# Patient Record
Sex: Male | Born: 1975 | Hispanic: Yes | Marital: Married
Health system: Northeastern US, Academic
[De-identification: ages and names within clinical notes are randomized; demographics above are authoritative.]

---

## 2018-09-06 ENCOUNTER — Emergency Department (HOSPITAL_COMMUNITY): Payer: Self-pay

## 2018-09-06 ENCOUNTER — Encounter (HOSPITAL_COMMUNITY): Payer: Self-pay | Admitting: *Deleted

## 2018-09-06 ENCOUNTER — Inpatient Hospital Stay (HOSPITAL_COMMUNITY): Payer: Self-pay

## 2018-09-06 ENCOUNTER — Inpatient Hospital Stay (HOSPITAL_COMMUNITY)
Admission: EM | Admit: 2018-09-06 | Discharge: 2018-09-09 | DRG: 200 | Disposition: A | Discharge status: Home / Self Care | Payer: Self-pay | Attending: General Surgery | Admitting: General Surgery

## 2018-09-06 ENCOUNTER — Other Ambulatory Visit: Payer: Self-pay

## 2018-09-06 DIAGNOSIS — Y990 Civilian activity done for income or pay: Secondary | ICD-10-CM

## 2018-09-06 DIAGNOSIS — S27321A Contusion of lung, unilateral, initial encounter: Secondary | ICD-10-CM | POA: Diagnosis present

## 2018-09-06 DIAGNOSIS — S2242XA Multiple fractures of ribs, left side, initial encounter for closed fracture: Secondary | ICD-10-CM | POA: Diagnosis present

## 2018-09-06 DIAGNOSIS — W19XXXA Unspecified fall, initial encounter: Secondary | ICD-10-CM

## 2018-09-06 DIAGNOSIS — M542 Cervicalgia: Secondary | ICD-10-CM | POA: Diagnosis present

## 2018-09-06 DIAGNOSIS — Y93H3 Activity, building and construction: Secondary | ICD-10-CM

## 2018-09-06 DIAGNOSIS — W132XXA Fall from, out of or through roof, initial encounter: Secondary | ICD-10-CM | POA: Diagnosis present

## 2018-09-06 DIAGNOSIS — J939 Pneumothorax, unspecified: Secondary | ICD-10-CM

## 2018-09-06 DIAGNOSIS — S300XXA Contusion of lower back and pelvis, initial encounter: Secondary | ICD-10-CM | POA: Diagnosis present

## 2018-09-06 DIAGNOSIS — S270XXA Traumatic pneumothorax, initial encounter: Principal | ICD-10-CM | POA: Diagnosis present

## 2018-09-06 DIAGNOSIS — Z4682 Encounter for fitting and adjustment of non-vascular catheter: Secondary | ICD-10-CM

## 2018-09-06 DIAGNOSIS — M25522 Pain in left elbow: Secondary | ICD-10-CM | POA: Diagnosis present

## 2018-09-06 DIAGNOSIS — Z880 Allergy status to penicillin: Secondary | ICD-10-CM

## 2018-09-06 DIAGNOSIS — Y92008 Other place in unspecified non-institutional (private) residence as the place of occurrence of the external cause: Secondary | ICD-10-CM

## 2018-09-06 LAB — CBC
HCT: 48.9 % (ref 39.0–52.0)
HEMATOCRIT: 47.4 % (ref 39.0–52.0)
HEMOGLOBIN: 15.4 g/dL (ref 13.0–17.0)
Hemoglobin: 15.7 g/dL (ref 13.0–17.0)
MCH: 30.1 pg (ref 26.0–34.0)
MCH: 29.5 pg (ref 26.0–34.0)
MCHC: 33.1 g/dL (ref 30.0–36.0)
MCHC: 31.5 g/dL (ref 30.0–36.0)
MCV: 91 fL (ref 78.0–100.0)
MCV: 93.7 fL (ref 78.0–100.0)
Platelets: 227 10*3/uL (ref 150–400)
Platelets: 225 10*3/uL (ref 150–400)
RBC: 5.21 MIL/uL (ref 4.22–5.81)
RBC: 5.22 MIL/uL (ref 4.22–5.81)
RDW: 13.1 % (ref 11.5–15.5)
RDW: 13.2 % (ref 11.5–15.5)
WBC: 9.1 10*3/uL (ref 4.0–10.5)
WBC: 14.9 10*3/uL — ABNORMAL HIGH (ref 4.0–10.5)

## 2018-09-06 LAB — I-STAT CHEM 8, ED
BUN: 17 mg/dL (ref 6–20)
CALCIUM ION: 1.1 mmol/L — AB (ref 1.15–1.40)
Chloride: 106 mmol/L (ref 98–111)
Creatinine, Ser: 1.1 mg/dL (ref 0.61–1.24)
Glucose, Bld: 117 mg/dL — ABNORMAL HIGH (ref 70–99)
HEMATOCRIT: 46 % (ref 39.0–52.0)
Hemoglobin: 15.6 g/dL (ref 13.0–17.0)
Potassium: 3.5 mmol/L (ref 3.5–5.1)
SODIUM: 141 mmol/L (ref 135–145)
TCO2: 23 mmol/L (ref 22–32)

## 2018-09-06 LAB — I-STAT CG4 LACTIC ACID, ED: LACTIC ACID, VENOUS: 2.16 mmol/L — AB (ref 0.5–1.9)

## 2018-09-06 LAB — COMPREHENSIVE METABOLIC PANEL
ALBUMIN: 4.2 g/dL (ref 3.5–5.0)
ALT: 37 U/L (ref 0–44)
AST: 39 U/L (ref 15–41)
Alkaline Phosphatase: 57 U/L (ref 38–126)
Anion gap: 10 (ref 5–15)
BUN: 15 mg/dL (ref 6–20)
CHLORIDE: 108 mmol/L (ref 98–111)
CO2: 23 mmol/L (ref 22–32)
Calcium: 9.1 mg/dL (ref 8.9–10.3)
Creatinine, Ser: 1.13 mg/dL (ref 0.61–1.24)
GFR calc Af Amer: >60 mL/min (ref >60)
GFR calc non Af Amer: >60 mL/min (ref >60)
GLUCOSE: 121 mg/dL — AB (ref 70–99)
POTASSIUM: 3.6 mmol/L (ref 3.5–5.1)
SODIUM: 141 mmol/L (ref 135–145)
Total Bilirubin: 0.5 mg/dL (ref 0.3–1.2)
Total Protein: 7.1 g/dL (ref 6.5–8.1)

## 2018-09-06 LAB — SAMPLE TO BLOOD BANK

## 2018-09-06 LAB — CREATININE, SERUM
Creatinine, Ser: 1.02 mg/dL (ref 0.61–1.24)
GFR calc Af Amer: >60 mL/min (ref >60)

## 2018-09-06 LAB — PROTIME-INR
INR: 1.06
Prothrombin Time: 13.7 seconds (ref 11.4–15.2)

## 2018-09-06 LAB — ETHANOL: Alcohol, Ethyl (B): <10 mg/dL (ref <10)

## 2018-09-06 MED ORDER — FENTANYL CITRATE (PF) 100 MCG/2ML IJ SOLN: 50.0000 ug | Freq: Once | INTRAMUSCULAR | Status: DC

## 2018-09-06 MED ORDER — LIDOCAINE-EPINEPHRINE (PF) 2 %-1:200000 IJ SOLN
20.0000 mL | Freq: Once | INTRAMUSCULAR | Status: DC
  Filled 2018-09-06: qty 20

## 2018-09-06 MED ORDER — METHOCARBAMOL 1000 MG/10ML IJ SOLN
500.0000 mg | Freq: Three times a day (TID) | INTRAVENOUS | Status: DC
  Administered 2018-09-06 – 2018-09-07 (×2): 500 mg via INTRAVENOUS
  Filled 2018-09-06 (×2): qty 5
  Filled 2018-09-06: qty 500
  Filled 2018-09-06 (×2): qty 5

## 2018-09-06 MED ORDER — METOPROLOL TARTRATE 5 MG/5ML IV SOLN: 5.0000 mg | Freq: Four times a day (QID) | INTRAVENOUS | Status: DC | PRN

## 2018-09-06 MED ORDER — FENTANYL CITRATE (PF) 100 MCG/2ML IJ SOLN
100.0000 ug | INTRAMUSCULAR | Status: DC | PRN
  Administered 2018-09-06: 100 ug via INTRAVENOUS
  Filled 2018-09-06 (×2): qty 2

## 2018-09-06 MED ORDER — ACETAMINOPHEN 500 MG PO TABS
1000.0000 mg | ORAL_TABLET | Freq: Four times a day (QID) | ORAL | Status: DC
  Administered 2018-09-06 – 2018-09-09 (×10): 1000 mg via ORAL
  Filled 2018-09-06 (×11): qty 2

## 2018-09-06 MED ORDER — FENTANYL CITRATE (PF) 100 MCG/2ML IJ SOLN
50.0000 ug | Freq: Once | INTRAMUSCULAR | Status: AC
  Administered 2018-09-06: 50 ug via INTRAVENOUS

## 2018-09-06 MED ORDER — KCL IN DEXTROSE-NACL 20-5-0.45 MEQ/L-%-% IV SOLN
INTRAVENOUS | Status: DC
  Administered 2018-09-06 – 2018-09-08 (×4): via INTRAVENOUS
  Filled 2018-09-06 (×4): qty 1000

## 2018-09-06 MED ORDER — KETAMINE HCL 50 MG/5ML IJ SOSY: 2.0000 mg/kg | PREFILLED_SYRINGE | Freq: Once | INTRAMUSCULAR | Status: DC

## 2018-09-06 MED ORDER — ONDANSETRON HCL 4 MG/2ML IJ SOLN: 4.0000 mg | Freq: Four times a day (QID) | INTRAMUSCULAR | Status: DC | PRN

## 2018-09-06 MED ORDER — HYDROMORPHONE HCL 1 MG/ML IJ SOLN
1.0000 mg | INTRAMUSCULAR | Status: DC | PRN
  Administered 2018-09-06: 1 mg via INTRAVENOUS
  Filled 2018-09-06: qty 1

## 2018-09-06 MED ORDER — IOPAMIDOL (ISOVUE-300) INJECTION 61%
INTRAVENOUS | Status: AC
  Filled 2018-09-06: qty 100

## 2018-09-06 MED ORDER — OXYCODONE HCL 5 MG PO TABS
5.0000 mg | ORAL_TABLET | ORAL | Status: DC | PRN
  Administered 2018-09-06 – 2018-09-07 (×3): 10 mg via ORAL
  Administered 2018-09-07 – 2018-09-09 (×3): 5 mg via ORAL
  Filled 2018-09-06: qty 1
  Filled 2018-09-06 (×2): qty 2
  Filled 2018-09-06 (×2): qty 1
  Filled 2018-09-06: qty 2

## 2018-09-06 MED ORDER — ONDANSETRON 4 MG PO TBDP: 4.0000 mg | ORAL_TABLET | Freq: Four times a day (QID) | ORAL | Status: DC | PRN

## 2018-09-06 MED ORDER — DOCUSATE SODIUM 100 MG PO CAPS
100.0000 mg | ORAL_CAPSULE | Freq: Two times a day (BID) | ORAL | Status: DC
  Administered 2018-09-06 – 2018-09-09 (×7): 100 mg via ORAL
  Filled 2018-09-06 (×7): qty 1

## 2018-09-06 MED ORDER — GABAPENTIN 300 MG PO CAPS
300.0000 mg | ORAL_CAPSULE | Freq: Three times a day (TID) | ORAL | Status: DC
  Administered 2018-09-06 – 2018-09-09 (×9): 300 mg via ORAL
  Filled 2018-09-06 (×9): qty 1

## 2018-09-06 MED ORDER — FAMOTIDINE IN NACL 20-0.9 MG/50ML-% IV SOLN
20.0000 mg | INTRAVENOUS | Status: DC
  Filled 2018-09-06: qty 50

## 2018-09-06 MED ORDER — MIDAZOLAM HCL 2 MG/2ML IJ SOLN
INTRAMUSCULAR | Status: AC
  Filled 2018-09-06: qty 2

## 2018-09-06 MED ORDER — MIDAZOLAM HCL 2 MG/2ML IJ SOLN
2.0000 mg | Freq: Once | INTRAMUSCULAR | Status: AC
  Administered 2018-09-06: 2 mg via INTRAVENOUS

## 2018-09-06 MED ORDER — IOPAMIDOL (ISOVUE-300) INJECTION 61%
100.0000 mL | Freq: Once | INTRAVENOUS | Status: AC | PRN
  Administered 2018-09-06: 100 mL via INTRAVENOUS

## 2018-09-06 MED ORDER — SODIUM CHLORIDE 0.9 % IV BOLUS
1000.0000 mL | Freq: Once | INTRAVENOUS | Status: AC
  Administered 2018-09-06: 1000 mL via INTRAVENOUS

## 2018-09-06 MED ORDER — ENOXAPARIN SODIUM 40 MG/0.4ML ~~LOC~~ SOLN
40.0000 mg | SUBCUTANEOUS | Status: DC
  Administered 2018-09-07 – 2018-09-09 (×3): 40 mg via SUBCUTANEOUS
  Filled 2018-09-06 (×3): qty 0.4

## 2018-09-06 MED ORDER — PANTOPRAZOLE SODIUM 40 MG PO TBEC
40.0000 mg | DELAYED_RELEASE_TABLET | ORAL | Status: DC
  Administered 2018-09-06 – 2018-09-08 (×3): 40 mg via ORAL
  Filled 2018-09-06 (×3): qty 1

## 2018-09-06 NOTE — ED Triage Notes (Signed)
Tc to x-ray for portable post chest tube placement

## 2018-09-06 NOTE — Procedures (Signed)
Chest Tube Insertion Procedure Note  Indications:  Clinically significant Pneumothorax  Pre-operative Diagnosis: Pneumothorax  Post-operative Diagnosis: Pneumothorax  Procedure Details  Informed consent was obtained for the procedure, including sedation.  Risks of lung perforation, hemorrhage, arrhythmia, and adverse drug reaction were discussed.   After sterile skin prep, using standard technique, a 14 French tube was placed in the left anterior 5th rib space.  Findings: None  Estimated Blood Loss:  Minimal         Specimens:  None              Complications:  None; patient tolerated the procedure well.         Disposition: admission to trauma service, stable condition          Condition: stable   Wells Guiles , [facility] Surgery 09/06/2018, 4:16 PM Pager: [phone] Mon-Fri 7:00 am-4:30 pm Sat-Sun 7:00 am-11:30 am

## 2018-09-06 NOTE — ED Notes (Signed)
MD notified of elevated lactic acid result

## 2018-09-06 NOTE — ED Provider Notes (Signed)
[facility] EMERGENCY DEPARTMENT Provider Note   CSN: [id number] Arrival date & time: 09/06/18  1159     History   Chief Complaint Chief Complaint  Patient presents with  . Fall    HPI Douglas Ayers is a 42 y.o. male who presents with a fall. No significant PMH per patient. He states that he was on a 1 story house and he slipped and fell off. He denies LOC or seizure. He was found by his coworkers. EMS responded and noted that the patient complained of left chest and abdominal pain and as well as left arm and leg pain. History is limited by language barrier.   LEVEL 5 caveat due to acuity and language barrier  HPI  History reviewed. No pertinent past medical history.  There are no active problems to display for this patient.   History reviewed. No pertinent surgical history.      Home Medications    Prior to Admission medications   Not on File    Family History History reviewed. No pertinent family history.  Social History Social History   Tobacco Use  . Smoking status: Never Smoker  . Smokeless tobacco: Never Used  Substance Use Topics  . Alcohol use: Not Currently  . Drug use: Never     Allergies   Patient has no known allergies.   Review of Systems Review of Systems  Respiratory: Positive for shortness of breath.   Cardiovascular: Positive for chest pain.  Gastrointestinal: Positive for abdominal pain. Negative for nausea and vomiting.  Musculoskeletal: Positive for arthralgias, back pain, joint swelling, myalgias and neck pain.  Skin: Positive for wound.  Neurological: Positive for headaches. Negative for syncope.  All other systems reviewed and are negative.    Physical Exam Updated Vital Signs BP (!) 138/95 (BP Location: Right Arm)   Pulse 87   Temp 98.9 F (37.2 C) (Oral)   Resp 20   Ht 5\' 5"  (1.651 m)   Wt 68 kg   SpO2 93%   BMI 24.96 kg/m   Physical Exam  Constitutional: He is oriented to person, place,  and time. He appears well-developed and well-nourished. No distress.  Calm, cooperative. Spanish speaking GCS 15. In C-collar  HENT:  Head: Normocephalic and atraumatic.  Eyes: Pupils are equal, round, and reactive to light. Conjunctivae are normal. Right eye exhibits no discharge. Left eye exhibits no discharge. No scleral icterus.  Neck: Normal range of motion.  Cardiovascular: Normal rate and regular rhythm.  Intact distal pulses  Pulmonary/Chest: Effort normal. No respiratory distress. He has decreased breath sounds. He exhibits tenderness.  Abdominal: Soft. Bowel sounds are normal. He exhibits no distension. There is tenderness (L sided abdomen and flank tenderness).  Musculoskeletal:  L upper extremity: Shoudler tenderness. No obvious deformity. Elbow swelling over olecranon with abrasions. 2+ radial pulse  Neurological: He is alert and oriented to person, place, and time.  Skin: Skin is warm and dry.  Psychiatric: He has a normal mood and affect. His behavior is normal.  Nursing note and vitals reviewed.    ED Treatments / Results  Labs (all labs ordered are listed, but only abnormal results are displayed) Labs Reviewed  COMPREHENSIVE METABOLIC PANEL - Abnormal; Notable for the following components:      Result Value   Glucose, Bld 121 (*)    All other components within normal limits  I-STAT CHEM 8, ED - Abnormal; Notable for the following components:   Glucose, Bld 117 (*)  Calcium, Ion 1.10 (*)    All other components within normal limits  I-STAT CG4 LACTIC ACID, ED - Abnormal; Notable for the following components:   Lactic Acid, Venous 2.16 (*)    All other components within normal limits  CBC  ETHANOL  PROTIME-INR  CDS SEROLOGY  URINALYSIS, ROUTINE W REFLEX MICROSCOPIC  SAMPLE TO BLOOD BANK    EKG None  Radiology Ct Head Wo Contrast  Result Date: 09/06/2018 CLINICAL DATA:  Pain following fall EXAM: CT HEAD WITHOUT CONTRAST CT CERVICAL SPINE WITHOUT CONTRAST  TECHNIQUE: Multidetector CT imaging of the head and cervical spine was performed following the standard protocol without intravenous contrast. Multiplanar CT image reconstructions of the cervical spine were also generated. COMPARISON:  None. FINDINGS: CT HEAD FINDINGS Brain: The ventricles are normal in size and configuration. There is no intracranial mass, hemorrhage, extra-axial fluid collection, or midline shift. The gray-white compartments appear normal. No evident acute infarct. Vascular: No hyperdense vessel. There is no appreciable vascular calcification. Skull: The bony calvarium appears intact. Sinuses/Orbits: There are retention cysts in the maxillary antra bilaterally, larger on the right than on the left. There is opacification and mucosal thickening in several ethmoid air cells bilaterally. Other visualized paranasal sinuses are clear. Orbits appear symmetric bilaterally. Other: Mastoid air cells are clear. CT CERVICAL SPINE FINDINGS Alignment: There is no evident spondylolisthesis. Skull base and vertebrae: Skull base and craniocervical junction regions appear normal. No evident fracture. No blastic or lytic bone lesions. Soft tissues and spinal canal: Prevertebral soft tissues and predental space regions are normal. No paraspinous lesion. No evident cord or canal hematoma. Disc levels: There is no appreciable disc space narrowing. There is calcification in the anterior ligament at C4-5. There is no appreciable nerve root edema or effacement. No disc extrusion or stenosis. Upper chest: There is a sizable pneumothorax on the left. Visualized right lung is clear. Other: None IMPRESSION: CT head: No intracranial mass or hemorrhage. Gray-white compartments appear normal. There is multifocal paranasal sinus disease. CT cervical spine: No fracture or spondylolisthesis. No appreciable nerve root edema or effacement. Sizable pneumothorax on the left. Critical Value/emergent results were called by telephone at  the time of interpretation on 09/06/2018 at 2:00 pm to [facility], who verbally acknowledged these results. Electronically Signed   By: Bretta Bang III M.D.   On: 09/06/2018 14:01   Ct Chest W Contrast  Result Date: 09/06/2018 CLINICAL DATA:  Left-sided abdominal pain and left flank bruising secondary to a fall from a roof today. EXAM: CT CHEST, ABDOMEN, AND PELVIS WITH CONTRAST TECHNIQUE: Multidetector CT imaging of the chest, abdomen and pelvis was performed following the standard protocol during bolus administration of intravenous contrast. CONTRAST:  ISOVUE-300 IOPAMIDOL (ISOVUE-300) INJECTION 61% COMPARISON:  None. FINDINGS: CT CHEST FINDINGS Cardiovascular: No significant vascular findings. Normal heart size. No pericardial effusion. Mediastinum/Nodes: No enlarged mediastinal, hilar, or axillary lymph nodes. Thyroid gland, trachea, and esophagus demonstrate no significant findings. Lungs/Pleura: There is a 40% left pneumothorax. There is a focal contusion of the left lower lobe adjacent to a fracture of the lateral aspect of the left seventh rib. There is a small left pleural effusion. Right lung is clear. Musculoskeletal: There are slightly displaced fractures of the lateral aspects of the left third, 6, seventh, and eighth ribs. There are also nondisplaced fractures of the posterior aspects of the left sixth, seventh and eighth ribs. CT ABDOMEN PELVIS FINDINGS Hepatobiliary: Diffuse slight hepatic steatosis. Liver parenchyma is otherwise normal. Biliary tree  is normal. Pancreas: Unremarkable. No pancreatic ductal dilatation or surrounding inflammatory changes. Spleen: No splenic injury or perisplenic hematoma. Adrenals/Urinary Tract: Adrenal glands are unremarkable. Kidneys are normal except for a 3.9 cm simple appearing cyst on the lower pole of the left kidney. Bladder is unremarkable. Stomach/Bowel: Stomach is within normal limits. Appendix appears normal. No evidence of bowel wall  thickening, distention, or inflammatory changes. Vascular/Lymphatic: No significant vascular findings are present. No enlarged abdominal or pelvic lymph nodes. Reproductive: Prostate is unremarkable. Other: Soft tissue contusion of the subcutaneous fat in the superior aspect of the left buttock just above the left posterosuperior iliac crest. Musculoskeletal: Negative. IMPRESSION: 1. 40% left pneumothorax. 2. Multiple left rib fractures as described. 3. Small focal pulmonary contusion in the left lower lobe. 4. Small left pleural effusion or hemothorax. 5. No acute intra-abdominal or intrapelvic abnormality. 6. Contusion of the subcutaneous fat of the superior aspect of the left buttock. Critical Value/emergent results were called by telephone at the time of interpretation on 09/06/2018 at 2:08 pm to Dr. Terance Hart , who verbally acknowledged these results. Electronically Signed   By: Francene Boyers M.D.   On: 09/06/2018 14:16   Ct Cervical Spine Wo Contrast  Result Date: 09/06/2018 CLINICAL DATA:  Pain following fall EXAM: CT HEAD WITHOUT CONTRAST CT CERVICAL SPINE WITHOUT CONTRAST TECHNIQUE: Multidetector CT imaging of the head and cervical spine was performed following the standard protocol without intravenous contrast. Multiplanar CT image reconstructions of the cervical spine were also generated. COMPARISON:  None. FINDINGS: CT HEAD FINDINGS Brain: The ventricles are normal in size and configuration. There is no intracranial mass, hemorrhage, extra-axial fluid collection, or midline shift. The gray-white compartments appear normal. No evident acute infarct. Vascular: No hyperdense vessel. There is no appreciable vascular calcification. Skull: The bony calvarium appears intact. Sinuses/Orbits: There are retention cysts in the maxillary antra bilaterally, larger on the right than on the left. There is opacification and mucosal thickening in several ethmoid air cells bilaterally. Other visualized paranasal  sinuses are clear. Orbits appear symmetric bilaterally. Other: Mastoid air cells are clear. CT CERVICAL SPINE FINDINGS Alignment: There is no evident spondylolisthesis. Skull base and vertebrae: Skull base and craniocervical junction regions appear normal. No evident fracture. No blastic or lytic bone lesions. Soft tissues and spinal canal: Prevertebral soft tissues and predental space regions are normal. No paraspinous lesion. No evident cord or canal hematoma. Disc levels: There is no appreciable disc space narrowing. There is calcification in the anterior ligament at C4-5. There is no appreciable nerve root edema or effacement. No disc extrusion or stenosis. Upper chest: There is a sizable pneumothorax on the left. Visualized right lung is clear. Other: None IMPRESSION: CT head: No intracranial mass or hemorrhage. Gray-white compartments appear normal. There is multifocal paranasal sinus disease. CT cervical spine: No fracture or spondylolisthesis. No appreciable nerve root edema or effacement. Sizable pneumothorax on the left. Critical Value/emergent results were called by telephone at the time of interpretation on 09/06/2018 at 2:00 pm to [facility] [place], who verbally acknowledged these results. Electronically Signed   By: Bretta Bang III M.D.   On: 09/06/2018 14:01   Ct Abdomen Pelvis W Contrast  Result Date: 09/06/2018 CLINICAL DATA:  Left-sided abdominal pain and left flank bruising secondary to a fall from a roof today. EXAM: CT CHEST, ABDOMEN, AND PELVIS WITH CONTRAST TECHNIQUE: Multidetector CT imaging of the chest, abdomen and pelvis was performed following the standard protocol during bolus administration of intravenous contrast. CONTRAST:  ISOVUE-300 IOPAMIDOL (ISOVUE-300) INJECTION 61% COMPARISON:  None. FINDINGS: CT CHEST FINDINGS Cardiovascular: No significant vascular findings. Normal heart size. No pericardial effusion. Mediastinum/Nodes: No enlarged mediastinal, hilar, or axillary  lymph nodes. Thyroid gland, trachea, and esophagus demonstrate no significant findings. Lungs/Pleura: There is a 40% left pneumothorax. There is a focal contusion of the left lower lobe adjacent to a fracture of the lateral aspect of the left seventh rib. There is a small left pleural effusion. Right lung is clear. Musculoskeletal: There are slightly displaced fractures of the lateral aspects of the left third, 6, seventh, and eighth ribs. There are also nondisplaced fractures of the posterior aspects of the left sixth, seventh and eighth ribs. CT ABDOMEN PELVIS FINDINGS Hepatobiliary: Diffuse slight hepatic steatosis. Liver parenchyma is otherwise normal. Biliary tree is normal. Pancreas: Unremarkable. No pancreatic ductal dilatation or surrounding inflammatory changes. Spleen: No splenic injury or perisplenic hematoma. Adrenals/Urinary Tract: Adrenal glands are unremarkable. Kidneys are normal except for a 3.9 cm simple appearing cyst on the lower pole of the left kidney. Bladder is unremarkable. Stomach/Bowel: Stomach is within normal limits. Appendix appears normal. No evidence of bowel wall thickening, distention, or inflammatory changes. Vascular/Lymphatic: No significant vascular findings are present. No enlarged abdominal or pelvic lymph nodes. Reproductive: Prostate is unremarkable. Other: Soft tissue contusion of the subcutaneous fat in the superior aspect of the left buttock just above the left posterosuperior iliac crest. Musculoskeletal: Negative. IMPRESSION: 1. 40% left pneumothorax. 2. Multiple left rib fractures as described. 3. Small focal pulmonary contusion in the left lower lobe. 4. Small left pleural effusion or hemothorax. 5. No acute intra-abdominal or intrapelvic abnormality. 6. Contusion of the subcutaneous fat of the superior aspect of the left buttock. Critical Value/emergent results were called by telephone at the time of interpretation on 09/06/2018 at 2:08 pm to Dr. Terance Hart , who  verbally acknowledged these results. Electronically Signed   By: Francene Boyers M.D.   On: 09/06/2018 14:16   Dg Pelvis Portable  Result Date: 09/06/2018 CLINICAL DATA:  Shortness of breath EXAM: PORTABLE PELVIS 1-2 VIEWS COMPARISON:  None. FINDINGS: The bony pelvis is subjectively adequately mineralized. There is no acute or healing fracture. The visualized portions of the hips exhibit no acute abnormalities. IMPRESSION: There is no acute bony abnormality of the pelvis. Electronically Signed   By: David  [place] M.D.   On: 09/06/2018 12:56   Dg Chest Port 1 View  Result Date: 09/06/2018 CLINICAL DATA:  Shortness of breath EXAM: PORTABLE CHEST 1 VIEW COMPARISON:  None in PACs FINDINGS: The lungs are mildly hypoinflated. There is hazy increased density laterally in the left mid and lower hemithorax. There are mildly displaced fractures of the lateral aspects of the left sixth, seventh, and eighth ribs. There is deformity of the lateral aspect of the left third rib which is of uncertain age. There is no pneumothorax or pleural effusion. The heart is top-normal in size. The pulmonary vascularity is normal. The bony thorax exhibits no acute abnormality. IMPRESSION: Abnormal appearance of the sixth through eighth ribs on the left with a small amount of increased parenchymal density in the left lung. This may reflect presence of acute fractures with pulmonary contusion. There is no pneumothorax or pleural effusion. There is deformity of the lateral aspect of the left third rib which is of uncertain age. Top-normal cardiac size without pulmonary vascular congestion or pulmonary edema. Electronically Signed   By: David  [place] M.D.   On: 09/06/2018 12:56    Procedures  Procedures (including critical care time)  Medications Ordered in ED Medications  sodium chloride 0.9 % bolus 1,000 mL (has no administration in time range)  fentaNYL (SUBLIMAZE) injection 100 mcg (100 mcg Intravenous Given 09/06/18 1257)    iopamidol (ISOVUE-300) 61 % injection (has no administration in time range)  lidocaine-EPINEPHrine (XYLOCAINE W/EPI) 2 %-1:200000 (PF) injection 20 mL (has no administration in time range)  ketamine 50 mg in normal saline 5 mL (10 mg/mL) syringe (has no administration in time range)  midazolam (VERSED) 2 MG/2ML injection (has no administration in time range)  iopamidol (ISOVUE-300) 61 % injection 100 mL (100 mLs Intravenous Contrast Given 09/06/18 1327)  fentaNYL (SUBLIMAZE) injection 50 mcg (50 mcg Intravenous Given 09/06/18 1459)  midazolam (VERSED) injection 2 mg (2 mg Intravenous Given 09/06/18 1457)     Initial Impression / Assessment and Plan / ED Course  I have reviewed the triage vital signs and the nursing notes.  Pertinent labs & imaging results that were available during my care of the patient were reviewed by me and considered in my medical decision making (see chart for details).  42PM 42 year old male presents with a fall from 1 story building. His vitals are normal. On exam he reports diffuse pain but primarily on the L side. Breath sounds are decreased bilaterally because of pain. Initial CXR and pelvis xray were negative. Labs are normal other than lactic acid which was slightly elevated. Bedside FAST exam was performed which was negative. Will obtain CT head, C-spine, chest, abdomen/pelvis.   2PM Received call from radiology that the patient has large 40% pneumothorax on the L side with multiple rib fractures and pulmonary contusion. Shared visit with Dr. Jacqulyn Bath. Will consult trauma  2:15PM Received call from Dr. Janee Morn with trauma - he will come to see pt to insert chest tube.   Final Clinical Impressions(s) / ED Diagnoses   Final diagnoses:  Fall, initial encounter  Pneumothorax, unspecified type  Closed fracture of multiple ribs of left side, initial encounter  Contusion of left lung, initial encounter    ED Discharge Orders    None       Bethel Born,  PA-C 09/06/18 1535    Long, Arlyss Repress, MD 09/06/18 [phone]

## 2018-09-06 NOTE — H&P (Signed)
[facility] Admission Note  Douglas Ayers 06-29-1976  [id number].     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 He is Spanish speaking. Does not remember falling, thinks he may have +LOC. Complains of left sided chest and abdominal pain. Denies medications or other medical problems. Allergic to PCNs. Denies alcohol or illicit drug use. He was brought in via EMS. Patient's co-workers were at the bedside.   ROS: Review of Systems  Constitutional: Negative for chills and fever.  HENT: Negative for ear discharge, nosebleeds and tinnitus.   Eyes: Negative for blurred vision and double vision.  Respiratory: Positive for shortness of breath.   Cardiovascular: Positive for chest pain. Negative for palpitations.  Gastrointestinal: Positive for abdominal pain. Negative for nausea and vomiting.  Musculoskeletal: Positive for falls, joint pain (L elbow) and neck pain.  Neurological: Positive for loss of consciousness. Negative for sensory change.  All other systems reviewed and are negative.   History reviewed. No pertinent family history.  History reviewed. No pertinent past medical history.  History reviewed. No pertinent surgical history.  Social History:  reports that he has never smoked. He has never used smokeless tobacco. He reports that he drank alcohol. He reports that he does not use drugs.  Allergies:  Allergies  Allergen Reactions  . Penicillins Other (See Comments)    Dizziness  Has patient had a PCN reaction causing immediate rash, facial/tongue/throat swelling, SOB or lightheadedness with hypotension: No Has patient had a PCN reaction causing severe rash involving mucus membranes or skin necrosis: No Has patient had a PCN reaction that required hospitalization: No Has patient had a PCN reaction occurring within the last 10 years: No If all of the above  answers are "NO", then may proceed with Cephalosporin use.      (Not in a hospital admission)  Blood pressure (!) 144/89, pulse 82, temperature 98.9 F (37.2 C), temperature source Oral, resp. rate (!) 29, height 5' 5"  (1.651 m), weight 68 kg, SpO2 100 %. Physical Exam: Physical Exam  Constitutional: He is oriented to person, place, and time. He appears well-developed and well-nourished. He is cooperative.  Non-toxic appearance. No distress. Cervical collar and nasal cannula in place.  HENT:  Head: Normocephalic. Head is without raccoon's eyes, without Battle's sign, without abrasion and without laceration.  Right Ear: Tympanic membrane, external ear and ear canal normal.  Left Ear: Tympanic membrane, external ear and ear canal normal.  Nose: Nose normal.  Mouth/Throat: Oropharynx is clear and moist and mucous membranes are normal.  Eyes: Pupils are equal, round, and reactive to light. Conjunctivae, EOM and lids are normal. No scleral icterus.  Neck: Phonation normal. Neck supple. Spinous process tenderness present. No tracheal deviation present.  Cardiovascular: Normal rate and regular rhythm.  Pulses:      Radial pulses are 2+ on the right side, and 2+ on the left side.       Dorsalis pedis pulses are 2+ on the right side, and 2+ on the left side.  No LE edema bilaterally   Pulmonary/Chest: Tachypnea noted. He has decreased breath sounds in the left upper field and the left middle field. He has no wheezes. He has no rhonchi.  Abdominal: Soft. Bowel sounds are normal. He exhibits no distension. There is no hepatosplenomegaly. There is tenderness in the left lower quadrant. There is guarding (mild). There is no rigidity and no rebound.  No hernia.  Musculoskeletal:  ROM limited in L elbow due to pain, no laceration or obvious deformity; ROM grossly intact in RUE and bilateral LEs  Neurological: He is alert and oriented to person, place, and time. No sensory deficit.  BL grip slightly  weakened  Skin: Skin is warm, dry and intact.  Psychiatric: He has a normal mood and affect. His speech is normal and behavior is normal.    Results for orders placed or performed during the hospital encounter of 09/06/18 (from the past 48 hour(s))  Comprehensive metabolic panel     Status: Abnormal   Collection Time: 09/06/18 12:22 PM  Result Value Ref Range   Sodium 141 135 - 145 mmol/L   Potassium 3.6 3.5 - 5.1 mmol/L   Chloride 108 98 - 111 mmol/L   CO2 23 22 - 32 mmol/L   Glucose, Bld 121 (H) 70 - 99 mg/dL   BUN 15 6 - 20 mg/dL   Creatinine, Ser 1.13 0.61 - 1.24 mg/dL   Calcium 9.1 8.9 - 10.3 mg/dL   Total Protein 7.1 6.5 - 8.1 g/dL   Albumin 4.2 3.5 - 5.0 g/dL   AST 39 15 - 41 U/L   ALT 37 0 - 44 U/L   Alkaline Phosphatase 57 38 - 126 U/L   Total Bilirubin 0.5 0.3 - 1.2 mg/dL   GFR calc non Af Amer >60 >60 mL/min   GFR calc Af Amer >60 >60 mL/min    Comment: (NOTE) The eGFR has been calculated using the CKD EPI equation. This calculation has not been validated in all clinical situations. eGFR's persistently <60 mL/min signify possible Chronic Kidney Disease.    Anion gap 10 5 - 15    Comment: Performed at [facility] [address] [place] [address]  CBC     Status: None   Collection Time: 09/06/18 12:22 PM  Result Value Ref Range   WBC 9.1 4.0 - 10.5 K/uL   RBC 5.21 4.22 - 5.81 MIL/uL   Hemoglobin 15.7 13.0 - 17.0 g/dL   HCT 47.4 39.0 - 52.0 %   MCV 91.0 78.0 - 100.0 fL   MCH 30.1 26.0 - 34.0 pg   MCHC 33.1 30.0 - 36.0 g/dL   RDW 13.1 11.5 - 15.5 %   Platelets 227 150 - 400 K/uL    Comment: Performed at [facility] [address], [place], [place] [address]  Ethanol     Status: None   Collection Time: 09/06/18 12:22 PM  Result Value Ref Range   Alcohol, Ethyl (B) <10 <10 mg/dL    Comment: (NOTE) Lowest detectable limit for serum alcohol is 10 mg/dL. For medical purposes only. Performed at [facility], [place] [address]  [place], [place] [address]   Protime-INR     Status: None   Collection Time: 09/06/18 12:22 PM  Result Value Ref Range   Prothrombin Time 13.7 11.4 - 15.2 seconds   INR 1.06     Comment: Performed at [facility], [place] [address], [place], [place] [address]  Sample to Blood Bank     Status: None   Collection Time: 09/06/18 12:24 PM  Result Value Ref Range   Blood Bank Specimen SAMPLE AVAILABLE FOR TESTING    Sample Expiration      09/07/2018 Performed at [facility], [place] [address] [place], [place] [address]   I-Stat Chem 8, ED     Status: Abnormal  Collection Time: 09/06/18 12:42 PM  Result Value Ref Range   Sodium 141 135 - 145 mmol/L   Potassium 3.5 3.5 - 5.1 mmol/L   Chloride 106 98 - 111 mmol/L   BUN 17 6 - 20 mg/dL   Creatinine, Ser 1.10 0.61 - 1.24 mg/dL   Glucose, Bld 117 (H) 70 - 99 mg/dL   Calcium, Ion 1.10 (L) 1.15 - 1.40 mmol/L   TCO2 23 22 - 32 mmol/L   Hemoglobin 15.6 13.0 - 17.0 g/dL   HCT 46.0 39.0 - 52.0 %  I-Stat CG4 Lactic Acid, ED     Status: Abnormal   Collection Time: 09/06/18 12:43 PM  Result Value Ref Range   Lactic Acid, Venous 2.16 (HH) 0.5 - 1.9 mmol/L   Comment NOTIFIED PHYSICIAN    Ct Head Wo Contrast  Result Date: 09/06/2018 CLINICAL DATA:  Pain following fall EXAM: CT HEAD WITHOUT CONTRAST CT CERVICAL SPINE WITHOUT CONTRAST TECHNIQUE: Multidetector CT imaging of the head and cervical spine was performed following the standard protocol without intravenous contrast. Multiplanar CT image reconstructions of the cervical spine were also generated. COMPARISON:  None. FINDINGS: CT HEAD FINDINGS Brain: The ventricles are normal in size and configuration. There is no intracranial mass, hemorrhage, extra-axial fluid collection, or midline shift. The gray-white compartments appear normal. No evident acute infarct. Vascular: No hyperdense vessel. There is no appreciable vascular calcification. Skull: The bony calvarium appears intact. Sinuses/Orbits:  There are retention cysts in the maxillary antra bilaterally, larger on the right than on the left. There is opacification and mucosal thickening in several ethmoid air cells bilaterally. Other visualized paranasal sinuses are clear. Orbits appear symmetric bilaterally. Other: Mastoid air cells are clear. CT CERVICAL SPINE FINDINGS Alignment: There is no evident spondylolisthesis. Skull base and vertebrae: Skull base and craniocervical junction regions appear normal. No evident fracture. No blastic or lytic bone lesions. Soft tissues and spinal canal: Prevertebral soft tissues and predental space regions are normal. No paraspinous lesion. No evident cord or canal hematoma. Disc levels: There is no appreciable disc space narrowing. There is calcification in the anterior ligament at C4-5. There is no appreciable nerve root edema or effacement. No disc extrusion or stenosis. Upper chest: There is a sizable pneumothorax on the left. Visualized right lung is clear. Other: None IMPRESSION: CT head: No intracranial mass or hemorrhage. Gray-white compartments appear normal. There is multifocal paranasal sinus disease. CT cervical spine: No fracture or spondylolisthesis. No appreciable nerve root edema or effacement. Sizable pneumothorax on the left. Critical Value/emergent results were called by telephone at the time of interpretation on 09/06/2018 at 2:00 pm to [facility], who verbally acknowledged these results. Electronically Signed   By: Lowella Grip III M.D.   On: 09/06/2018 14:01   Ct Chest W Contrast  Result Date: 09/06/2018 CLINICAL DATA:  Left-sided abdominal pain and left flank bruising secondary to a fall from a roof today. EXAM: CT CHEST, ABDOMEN, AND PELVIS WITH CONTRAST TECHNIQUE: Multidetector CT imaging of the chest, abdomen and pelvis was performed following the standard protocol during bolus administration of intravenous contrast. CONTRAST:  154m ISOVUE-300 IOPAMIDOL (ISOVUE-300) INJECTION 61%  COMPARISON:  None. FINDINGS: CT CHEST FINDINGS Cardiovascular: No significant vascular findings. Normal heart size. No pericardial effusion. Mediastinum/Nodes: No enlarged mediastinal, hilar, or axillary lymph nodes. Thyroid gland, trachea, and esophagus demonstrate no significant findings. Lungs/Pleura: There is a 40% left pneumothorax. There is a focal contusion of the left lower lobe adjacent to a fracture of the lateral  aspect of the left seventh rib. There is a small left pleural effusion. Right lung is clear. Musculoskeletal: There are slightly displaced fractures of the lateral aspects of the left third, 6, seventh, and eighth ribs. There are also nondisplaced fractures of the posterior aspects of the left sixth, seventh and eighth ribs. CT ABDOMEN PELVIS FINDINGS Hepatobiliary: Diffuse slight hepatic steatosis. Liver parenchyma is otherwise normal. Biliary tree is normal. Pancreas: Unremarkable. No pancreatic ductal dilatation or surrounding inflammatory changes. Spleen: No splenic injury or perisplenic hematoma. Adrenals/Urinary Tract: Adrenal glands are unremarkable. Kidneys are normal except for a 3.9 cm simple appearing cyst on the lower pole of the left kidney. Bladder is unremarkable. Stomach/Bowel: Stomach is within normal limits. Appendix appears normal. No evidence of bowel wall thickening, distention, or inflammatory changes. Vascular/Lymphatic: No significant vascular findings are present. No enlarged abdominal or pelvic lymph nodes. Reproductive: Prostate is unremarkable. Other: Soft tissue contusion of the subcutaneous fat in the superior aspect of the left buttock just above the left posterosuperior iliac crest. Musculoskeletal: Negative. IMPRESSION: 1. 40% left pneumothorax. 2. Multiple left rib fractures as described. 3. Small focal pulmonary contusion in the left lower lobe. 4. Small left pleural effusion or hemothorax. 5. No acute intra-abdominal or intrapelvic abnormality. 6. Contusion of  the subcutaneous fat of the superior aspect of the left buttock. Critical Value/emergent results were called by telephone at the time of interpretation on 09/06/2018 at 2:08 pm to Dr. Janetta Hora , who verbally acknowledged these results. Electronically Signed   By: Lorriane Shire M.D.   On: 09/06/2018 14:16   Ct Cervical Spine Wo Contrast  Result Date: 09/06/2018 CLINICAL DATA:  Pain following fall EXAM: CT HEAD WITHOUT CONTRAST CT CERVICAL SPINE WITHOUT CONTRAST TECHNIQUE: Multidetector CT imaging of the head and cervical spine was performed following the standard protocol without intravenous contrast. Multiplanar CT image reconstructions of the cervical spine were also generated. COMPARISON:  None. FINDINGS: CT HEAD FINDINGS Brain: The ventricles are normal in size and configuration. There is no intracranial mass, hemorrhage, extra-axial fluid collection, or midline shift. The gray-white compartments appear normal. No evident acute infarct. Vascular: No hyperdense vessel. There is no appreciable vascular calcification. Skull: The bony calvarium appears intact. Sinuses/Orbits: There are retention cysts in the maxillary antra bilaterally, larger on the right than on the left. There is opacification and mucosal thickening in several ethmoid air cells bilaterally. Other visualized paranasal sinuses are clear. Orbits appear symmetric bilaterally. Other: Mastoid air cells are clear. CT CERVICAL SPINE FINDINGS Alignment: There is no evident spondylolisthesis. Skull base and vertebrae: Skull base and craniocervical junction regions appear normal. No evident fracture. No blastic or lytic bone lesions. Soft tissues and spinal canal: Prevertebral soft tissues and predental space regions are normal. No paraspinous lesion. No evident cord or canal hematoma. Disc levels: There is no appreciable disc space narrowing. There is calcification in the anterior ligament at C4-5. There is no appreciable nerve root edema or  effacement. No disc extrusion or stenosis. Upper chest: There is a sizable pneumothorax on the left. Visualized right lung is clear. Other: None IMPRESSION: CT head: No intracranial mass or hemorrhage. Gray-white compartments appear normal. There is multifocal paranasal sinus disease. CT cervical spine: No fracture or spondylolisthesis. No appreciable nerve root edema or effacement. Sizable pneumothorax on the left. Critical Value/emergent results were called by telephone at the time of interpretation on 09/06/2018 at 2:00 pm to [facility], [facility], who verbally acknowledged these results. Electronically Signed   By: Gwyndolyn Saxon  Jasmine December III M.D.   On: 09/06/2018 14:01   Ct Abdomen Pelvis W Contrast  Result Date: 09/06/2018 CLINICAL DATA:  Left-sided abdominal pain and left flank bruising secondary to a fall from a roof today. EXAM: CT CHEST, ABDOMEN, AND PELVIS WITH CONTRAST TECHNIQUE: Multidetector CT imaging of the chest, abdomen and pelvis was performed following the standard protocol during bolus administration of intravenous contrast. CONTRAST:  163m ISOVUE-300 IOPAMIDOL (ISOVUE-300) INJECTION 61% COMPARISON:  None. FINDINGS: CT CHEST FINDINGS Cardiovascular: No significant vascular findings. Normal heart size. No pericardial effusion. Mediastinum/Nodes: No enlarged mediastinal, hilar, or axillary lymph nodes. Thyroid gland, trachea, and esophagus demonstrate no significant findings. Lungs/Pleura: There is a 40% left pneumothorax. There is a focal contusion of the left lower lobe adjacent to a fracture of the lateral aspect of the left seventh rib. There is a small left pleural effusion. Right lung is clear. Musculoskeletal: There are slightly displaced fractures of the lateral aspects of the left third, 6, seventh, and eighth ribs. There are also nondisplaced fractures of the posterior aspects of the left sixth, seventh and eighth ribs. CT ABDOMEN PELVIS FINDINGS Hepatobiliary: Diffuse slight hepatic steatosis.  Liver parenchyma is otherwise normal. Biliary tree is normal. Pancreas: Unremarkable. No pancreatic ductal dilatation or surrounding inflammatory changes. Spleen: No splenic injury or perisplenic hematoma. Adrenals/Urinary Tract: Adrenal glands are unremarkable. Kidneys are normal except for a 3.9 cm simple appearing cyst on the lower pole of the left kidney. Bladder is unremarkable. Stomach/Bowel: Stomach is within normal limits. Appendix appears normal. No evidence of bowel wall thickening, distention, or inflammatory changes. Vascular/Lymphatic: No significant vascular findings are present. No enlarged abdominal or pelvic lymph nodes. Reproductive: Prostate is unremarkable. Other: Soft tissue contusion of the subcutaneous fat in the superior aspect of the left buttock just above the left posterosuperior iliac crest. Musculoskeletal: Negative. IMPRESSION: 1. 40% left pneumothorax. 2. Multiple left rib fractures as described. 3. Small focal pulmonary contusion in the left lower lobe. 4. Small left pleural effusion or hemothorax. 5. No acute intra-abdominal or intrapelvic abnormality. 6. Contusion of the subcutaneous fat of the superior aspect of the left buttock. Critical Value/emergent results were called by telephone at the time of interpretation on 09/06/2018 at 2:08 pm to Dr. KJanetta Hora, who verbally acknowledged these results. Electronically Signed   By: JLorriane ShireM.D.   On: 09/06/2018 14:16   Dg Pelvis Portable  Result Date: 09/06/2018 CLINICAL DATA:  Shortness of breath EXAM: PORTABLE PELVIS 1-2 VIEWS COMPARISON:  None. FINDINGS: The bony pelvis is subjectively adequately mineralized. There is no acute or healing fracture. The visualized portions of the hips exhibit no acute abnormalities. IMPRESSION: There is no acute bony abnormality of the pelvis. Electronically Signed   By: David  JMartiniqueM.D.   On: 09/06/2018 12:56   Dg Chest Port 1 View  Result Date: 09/06/2018 CLINICAL DATA:  Pneumothorax  following fall EXAM: PORTABLE CHEST 1 VIEW COMPARISON:  Chest CT and chest radiograph September 06, 2018 FINDINGS: There is a chest tube on the left. There is subcutaneous air on the left but no evident pneumothorax. There are displaced fractures of the lateral left sixth and seventh ribs. There is a nondisplaced fracture of the lateral left eighth rib. No evident edema or consolidation. Heart size and pulmonary vascularity are normal. No adenopathy. IMPRESSION: No pneumothorax following chest tube placement. There is subcutaneous air on the left. There are displaced rib fractures on the left. No edema or consolidation. Heart size normal. Electronically Signed  By: Lowella Grip III M.D.   On: 09/06/2018 15:57   Dg Chest Port 1 View  Result Date: 09/06/2018 CLINICAL DATA:  Shortness of breath EXAM: PORTABLE CHEST 1 VIEW COMPARISON:  None in PACs FINDINGS: The lungs are mildly hypoinflated. There is hazy increased density laterally in the left mid and lower hemithorax. There are mildly displaced fractures of the lateral aspects of the left sixth, seventh, and eighth ribs. There is deformity of the lateral aspect of the left third rib which is of uncertain age. There is no pneumothorax or pleural effusion. The heart is top-normal in size. The pulmonary vascularity is normal. The bony thorax exhibits no acute abnormality. IMPRESSION: Abnormal appearance of the sixth through eighth ribs on the left with a small amount of increased parenchymal density in the left lung. This may reflect presence of acute fractures with pulmonary contusion. There is no pneumothorax or pleural effusion. There is deformity of the lateral aspect of the left third rib which is of uncertain age. Top-normal cardiac size without pulmonary vascular congestion or pulmonary edema. Electronically Signed   By: David  Martinique M.D.   On: 09/06/2018 12:56      Assessment/Plan Fall from 1 story Multiple Left sided rib fractures with L hptx  and pulmonary contusion - chest tube, pulm toilet, IS, pain control - repeat CXR in AM L buttock contusion - monitor CBC L elbow pain - plain films pending Cervical pain - keep in collar and get flex/ex films  FEN: NPO, IVF VTE: SCDs, lovenox to start tomorrow if H/H stable ID: no abx indicated  Admit to med-surg for chest tube, pain control. Plain films pending.   Brigid Re, [facility] 09/06/2018, 4:10 PM Pager: [phone] Mon-Fri 7:00 am-4:30 pm Sat-Sun 7:00 am-11:30 am

## 2018-09-06 NOTE — ED Triage Notes (Signed)
EMS reported Pt had an unwitnessed fall approx 10 feet off a single storie roof. On arrival Pt was found lying on his Left side. Pt limited english speaking . Per EMS Pt had pain to Lt ribs ,Lt flank , Hematoma to Lt elbow and Bruseing to Lt flank /Ltback. . EMS vitals BP 140/90, P 90,  PT reports he slipped. PT AO .

## 2018-09-07 ENCOUNTER — Inpatient Hospital Stay (HOSPITAL_COMMUNITY): Payer: Self-pay

## 2018-09-07 LAB — CBC
HCT: 41.4 % (ref 39.0–52.0)
Hemoglobin: 13.5 g/dL (ref 13.0–17.0)
MCH: 29.5 pg (ref 26.0–34.0)
MCHC: 32.6 g/dL (ref 30.0–36.0)
MCV: 90.6 fL (ref 78.0–100.0)
PLATELETS: 183 10*3/uL (ref 150–400)
RBC: 4.57 MIL/uL (ref 4.22–5.81)
RDW: 13.4 % (ref 11.5–15.5)
WBC: 8.9 10*3/uL (ref 4.0–10.5)

## 2018-09-07 LAB — BASIC METABOLIC PANEL
Anion gap: 11 (ref 5–15)
BUN: 9 mg/dL (ref 6–20)
CALCIUM: 8.2 mg/dL — AB (ref 8.9–10.3)
CO2: 22 mmol/L (ref 22–32)
CREATININE: 0.84 mg/dL (ref 0.61–1.24)
Chloride: 105 mmol/L (ref 98–111)
GFR calc Af Amer: >60 mL/min (ref >60)
GLUCOSE: 105 mg/dL — AB (ref 70–99)
POTASSIUM: 3.6 mmol/L (ref 3.5–5.1)
SODIUM: 138 mmol/L (ref 135–145)

## 2018-09-07 LAB — CDS SEROLOGY

## 2018-09-07 LAB — HIV ANTIBODY (ROUTINE TESTING W REFLEX): HIV Screen 4th Generation wRfx: NONREACTIVE

## 2018-09-07 MED ORDER — METHOCARBAMOL 500 MG PO TABS
1000.0000 mg | ORAL_TABLET | Freq: Three times a day (TID) | ORAL | Status: DC
  Administered 2018-09-07 – 2018-09-09 (×7): 1000 mg via ORAL
  Filled 2018-09-07 (×7): qty 2

## 2018-09-07 NOTE — Evaluation (Signed)
Physical Therapy Evaluation Patient Details Name: Douglas Ayers MRN: [id number] DOB: 1976-10-25 Today's Date: 09/07/2018   History of Present Illness  Pt presents after falling off roof at work, amnesic to event, L pneumothorax with CT placement on 09/07/18 and rib fxs.   Clinical Impression  Pt admitted with above diagnosis. Pt currently with functional limitations due to the deficits listed below (see PT Problem List). Pt with some dizziness with position changes as well as eye burning and watering on eval. He ambulated 16' within the room with HHA with no major balance deficits noted but limited by tube length of suction to wall. Pt mentions that he cannot remember event and was unaware of what was going on for about 20 mins. Discussed the possibility of concussion with him using teleinterpreter.  Pt will benefit from skilled PT to increase their independence and safety with mobility to allow discharge to the venue listed below.       Follow Up Recommendations No PT follow up    Equipment Recommendations  None recommended by PT    Recommendations for Other Services       Precautions / Restrictions Precautions Precautions: None Restrictions Weight Bearing Restrictions: No Other Position/Activity Restrictions: L CT      Mobility  Bed Mobility               General bed mobility comments: pt received in chair  Transfers Overall transfer level: Needs assistance Equipment used: None Transfers: Sit to/from Stand Sit to Stand: Min assist         General transfer comment: pt having burning of eyes and some dizziness, required HHA with initial standing. He reported that dizziness decreased as he was up  Ambulation/Gait Ambulation/Gait assistance: Min assist;+2 safety/equipment Gait Distance (Feet): 16 Feet Assistive device: 2 person hand held assist Gait Pattern/deviations: Step-through pattern;Decreased stride length Gait velocity: decreased Gait velocity  interpretation: <1.8 ft/sec, indicate of risk for recurrent falls General Gait Details: small steps, increased steadiness with distance, limited by suction to wall for chest tube  Stairs            Wheelchair Mobility    Modified Rankin (Stroke Patients Only)       Balance Overall balance assessment: Mild deficits observed, not formally tested(will further assess when dizziness improves)                                           Pertinent Vitals/Pain Pain Assessment: Faces Faces Pain Scale: Hurts even more Pain Location: L chest Pain Descriptors / Indicators: Grimacing Pain Intervention(s): Limited activity within patient's tolerance;Monitored during session    Home Living Family/patient expects to be discharged to:: Private residence Living Arrangements: Spouse/significant other Available Help at Discharge: Family;Available 24 hours/day Type of Home: Other(Comment)(duplex) Home Access: Stairs to enter   Entrance Stairs-Number of Steps: 2 Home Layout: One level Home Equipment: None      Prior Function Level of Independence: Independent         Comments: pt is Spanish speaking, teleinterpreter used     Hand Dominance        Extremity/Trunk Assessment   Upper Extremity Assessment Upper Extremity Assessment: Defer to OT evaluation    Lower Extremity Assessment Lower Extremity Assessment: Overall WFL for tasks assessed    Cervical / Trunk Assessment Cervical / Trunk Assessment: Normal  Communication   Communication: Prefers language other than  English  Cognition Arousal/Alertness: Awake/alert Behavior During Therapy: WFL for tasks assessed/performed Overall Cognitive Status: Within Functional Limits for tasks assessed(per wife)                                 General Comments: pt reports that he does not think he hit his head but cannot remember event and reports that he was "out of it" for about 20 mins       General Comments General comments (skin integrity, edema, etc.): pt denies blurry or double vision. Had some difficulties with tracking, see OT note for further details. Discussed concussion symptoms with pt     Exercises     Assessment/Plan    PT Assessment Patient needs continued PT services  PT Problem List Decreased balance;Decreased mobility;Decreased activity tolerance;Decreased knowledge of precautions;Pain       PT Treatment Interventions Gait training;Stair training;Functional mobility training;Therapeutic activities;Therapeutic exercise;Balance training;Patient/family education    PT Goals (Current goals can be found in the Care Plan section)  Acute Rehab PT Goals Patient Stated Goal: return home PT Goal Formulation: With patient Time For Goal Achievement: 09/21/18 Potential to Achieve Goals: Good    Frequency Min 3X/week   Barriers to discharge        Co-evaluation PT/OT/SLP Co-Evaluation/Treatment: Yes Reason for Co-Treatment: Complexity of the patient's impairments (multi-system involvement);For patient/therapist safety PT goals addressed during session: Mobility/safety with mobility;Balance         AM-PAC PT "6 Clicks" Daily Activity  Outcome Measure Difficulty turning over in bed (including adjusting bedclothes, sheets and blankets)?: Unable Difficulty moving from lying on back to sitting on the side of the bed? : A Little Difficulty sitting down on and standing up from a chair with arms (e.g., wheelchair, bedside commode, etc,.)?: A Little Help needed moving to and from a bed to chair (including a wheelchair)?: A Little Help needed walking in hospital room?: A Little Help needed climbing 3-5 steps with a railing? : A Little 6 Click Score: 16    End of Session   Activity Tolerance: Patient tolerated treatment well Patient left: in chair;with call bell/phone within reach;with family/visitor present Nurse Communication: Mobility status PT Visit  Diagnosis: Unsteadiness on feet (R26.81);Dizziness and giddiness (R42);Pain Pain - Right/Left: Left Pain - part of body: (chest)    Time: 1610-9604 PT Time Calculation (min) (ACUTE ONLY): 32 min   Charges:   PT Evaluation $PT Eval Low Complexity: 1 Low          Lyanne Co, PT  Acute Rehab Services  Pager [phone] Office [phone]   Lawana Chambers Ricco Dershem 09/07/2018, 3:33 PM

## 2018-09-07 NOTE — Progress Notes (Signed)
Occupational Therapy Evaluation  Pt admitted with the below listed diagnosis and demonstrates the below listed deficits.  He is limited by chest and rib pain, and reports dizziness with Lt gaze - ? Mild nystagmus noted. He requires mod - max A for ADLs due to pain, and min guard assist for functional mobility.  He lives with wife, who is supportive and can assist at discharge.  He was fully independent PTA.  Will follow acutely.  No follow up OT recommended.    09/07/18 1600  OT Visit Information  Last OT Received On 09/07/18  Assistance Needed +1  PT/OT/SLP Co-Evaluation/Treatment Yes  History of Present Illness Pt presents after falling off roof at work, amnesic to event, L pneumothorax with CT placement on 09/07/18 and rib fxs.   Precautions  Precautions None  Restrictions  Weight Bearing Restrictions No  Other Position/Activity Restrictions L CT  Home Living  Family/patient expects to be discharged to: Private residence  Living Arrangements Spouse/significant other  Available Help at Discharge Family;Available 24 hours/day  Type of Home Other(Comment) (duplex)  Home Access Stairs to enter  Entrance Stairs-Number of Steps 2  Home Layout One level  Bathroom Environmental health practitioner None  Prior Function  Level of Independence Independent  Comments pt is Spanish speaking, teleinterpreter used  Communication  Communication Prefers language other than English  Pain Assessment  Pain Assessment Faces  Faces Pain Scale 6  Pain Location L chest  Pain Descriptors / Indicators Grimacing  Pain Intervention(s) Monitored during session;Limited activity within patient's tolerance;Repositioned  Cognition  Arousal/Alertness Awake/alert  Behavior During Therapy WFL for tasks assessed/performed  Overall Cognitive Status Within Functional Limits for tasks assessed (per wife)  General Comments pt reports that he does not think he hit his head but  cannot remember event and reports that he was "out of it" for about 20 mins  Upper Extremity Assessment  Upper Extremity Assessment Generalized weakness (movement causes pain chest and ribs)  Lower Extremity Assessment  Lower Extremity Assessment Overall WFL for tasks assessed  Cervical / Trunk Assessment  Cervical / Trunk Assessment Normal  ADL  Overall ADL's  Needs assistance/impaired  Eating/Feeding Moderate assistance;Sitting  Eating/Feeding Details (indicate cue type and reason) wife was feeding pt.  He can self feed but does report arm movement increases chest pain  Grooming Wash/dry hands;Wash/dry face;Oral care;Brushing hair;Moderate assistance;Sitting  Upper Body Bathing Moderate assistance;Sitting  Lower Body Bathing Maximal assistance;Sit to/from stand  Upper Body Dressing  Moderate assistance;Sitting  Lower Body Dressing Maximal assistance;Sit to/from Freight forwarder guard;Ambulation;Comfort height toilet  Toileting- Clothing Manipulation and Hygiene Moderate assistance;Sit to/from stand  Functional mobility during ADLs Min guard  General ADL Comments limited by chest and rib pain with UE movement  Vision- History  Baseline Vision/History No visual deficits  Patient Visual Report No change from baseline  Vision- Assessment  Vision Assessment? Yes  Tracking/Visual Pursuits Decreased smoothness of horizontal tracking  Perception  Perception Tested? Yes  Praxis  Praxis tested? WFL  Bed Mobility  General bed mobility comments pt received in chair  Transfers  Overall transfer level Needs assistance  Equipment used None  Transfers Sit to/from Stand  Sit to Stand Min assist  General transfer comment pt having burning of eyes and some dizziness, required HHA with initial standing. He reported that dizziness decreased as he was up  Balance  Overall balance assessment Mild deficits observed, not formally tested (will further assess when dizziness  improves)   General Comments  General comments (skin integrity, edema, etc.) Pt and wife instructed in signs and symptoms of concussion, via video interpreter.   Pt frequently blinks eys and states they burn.    OT - End of Session  Activity Tolerance Patient limited by pain  Patient left in chair;with call bell/phone within reach;with family/visitor present  Nurse Communication Mobility status  OT Assessment  OT Recommendation/Assessment Patient needs continued OT Services  OT Visit Diagnosis Pain  Pain - Right/Left Left  Pain - part of body  (chest and ribs)  OT Problem List Decreased strength;Decreased activity tolerance;Impaired balance (sitting and/or standing);Impaired vision/perception;Decreased knowledge of use of DME or AE;Pain  OT Plan  OT Frequency (ACUTE ONLY) Min 2X/week  OT Treatment/Interventions (ACUTE ONLY) Self-care/ADL training;DME and/or AE instruction;Therapeutic activities;Patient/family education;Other (comment);Therapeutic exercise;Cognitive remediation/compensation;Visual/perceptual remediation/compensation  AM-PAC OT "6 Clicks" Daily Activity Outcome Measure  Help from another person eating meals? 2  Help from another person taking care of personal grooming? 2  Help from another person toileting, which includes using toliet, bedpan, or urinal? 2  Help from another person bathing (including washing, rinsing, drying)? 2  Help from another person to put on and taking off regular upper body clothing? 2  Help from another person to put on and taking off regular lower body clothing? 2  6 Click Score 12  ADL G Code Conversion CL  OT Recommendation  Follow Up Recommendations No OT follow up;Supervision - Intermittent  OT Equipment None recommended by OT  Individuals Consulted  Consulted and Agree with Results and Recommendations Patient  Acute Rehab OT Goals  Patient Stated Goal have less pain  OT Goal Formulation With patient  Time For Goal Achievement 09/21/18  Potential  to Achieve Goals Good  OT Time Calculation  OT Start Time (ACUTE ONLY) 1409  OT Stop Time (ACUTE ONLY) 1438  OT Time Calculation (min) 29 min  OT General Charges  $OT Visit 1 Visit  OT Evaluation  $OT Eval Moderate Complexity 1 Mod  Written Expression  Dominant Hand Right  Jeani Hawking, OTR/L Acute Rehabilitation Services Pager [phone] Office [phone]

## 2018-09-07 NOTE — Progress Notes (Signed)
[facility] Progress Note     Subjective: CC: left chest and back pain Patient states he feels better than yesterday but is still having pain. He has had only liquids overnight but done fine with those. He denies nausea or abdominal pain. He denies numbness or tingling in extremities but states he does still have some minor pain in left elbow. No IS in room.   Objective: Vital signs in last 24 hours: Temp:  [97.8 F (36.6 C)-99.2 F (37.3 C)] 97.8 F (36.6 C) (09/10 4098) Pulse Rate:  [80-98] 80 (09/10 0632) Resp:  [18-29] 18 (09/10 0632) BP: (119-148)/(79-100) 129/82 (09/10 0632) SpO2:  [90 %-100 %] 99 % (09/10 1191) Weight:  [68 kg] 68 kg (09/09 1216) Last BM Date: 09/06/18  Intake/Output from previous day: 09/09 0701 - 09/10 0700 In: 2308 [P.O.:100; I.V.:1108; IV Piggyback:1100] Out: 0  Intake/Output this shift: No intake/output data recorded.  Physical Exam  Constitutional: He is oriented to person, place, and time. He appears well-developed and well-nourished. No distress.  HENT:  Head: Normocephalic.  Right Ear: External ear normal.  Left Ear: External ear normal.  Nose: Nose normal.  Mouth/Throat: Oropharynx is clear and moist.  Eyes: Pupils are equal, round, and reactive to light. Conjunctivae and EOM are normal. No scleral icterus.  Neck: Normal range of motion. Neck supple.  Cardiovascular: Normal rate, regular rhythm and intact distal pulses.  Pulmonary/Chest: Effort normal and breath sounds normal. He exhibits tenderness.  Left chest tube with no air leak, no drainage  Abdominal: Soft. Bowel sounds are normal. He exhibits no distension. There is tenderness (very mild in LLQ). There is no rebound and no guarding.  Musculoskeletal: He exhibits no deformity.  ROM slightly limited by pain in LUE at shoulder and elbow  Neurological: He is alert and oriented to person, place, and time. No sensory deficit. He exhibits normal muscle tone.  Skin: Skin is  warm and dry. He is not diaphoretic.  Psychiatric: He has a normal mood and affect. His behavior is normal. Judgment and thought content normal.    Lab Results:  Recent Labs    09/06/18 1853 09/07/18 0550  WBC 14.9* 8.9  HGB 15.4 13.5  HCT 48.9 41.4  PLT 225 183   BMET Recent Labs    09/06/18 1222 09/06/18 1242 09/06/18 1853 09/07/18 0550  NA 141 141  --  138  K 3.6 3.5  --  3.6  CL 108 106  --  105  CO2 23  --   --  22  GLUCOSE 121* 117*  --  105*  BUN 15 17  --  9  CREATININE 1.13 1.10 1.02 0.84  CALCIUM 9.1  --   --  8.2*   PT/INR Recent Labs    09/06/18 1222  LABPROT 13.7  INR 1.06   CMP     Component Value Date/Time   NA 138 09/07/2018 0550   K 3.6 09/07/2018 0550   CL 105 09/07/2018 0550   CO2 22 09/07/2018 0550   GLUCOSE 105 (H) 09/07/2018 0550   BUN 9 09/07/2018 0550   CREATININE 0.84 09/07/2018 0550   CALCIUM 8.2 (L) 09/07/2018 0550   PROT 7.1 09/06/2018 1222   ALBUMIN 4.2 09/06/2018 1222   AST 39 09/06/2018 1222   ALT 37 09/06/2018 1222   ALKPHOS 57 09/06/2018 1222   BILITOT 0.5 09/06/2018 1222   GFRNONAA >60 09/07/2018 0550   GFRAA >60 09/07/2018 0550   Lipase  No results found for:  LIPASE     Studies/Results: Dg Cervical Spine With Flex & Extend  Result Date: 09/06/2018 CLINICAL DATA:  Fall from house. EXAM: CERVICAL SPINE COMPLETE WITH FLEXION AND EXTENSION VIEWS COMPARISON:  None. FINDINGS: The alignment appears normal. No fractures identified. No significant listhesis between the flexion and extension images. IMPRESSION: Negative exam. Electronically Signed   By: Signa Kell M.D.   On: 09/06/2018 18:10   Dg Elbow Complete Left (3+view)  Result Date: 09/06/2018 CLINICAL DATA:  Left arm pain EXAM: LEFT ELBOW - COMPLETE 3+ VIEW COMPARISON:  None. FINDINGS: No definite acute fracture or dislocation. No evidence of joint effusion. Several radiopaque punctate and linear opacities project over the soft tissues of unknown significance.  IMPRESSION: No acute bony pathology. Electronically Signed   By: Jolaine Click M.D.   On: 09/06/2018 18:11   Ct Head Wo Contrast  Result Date: 09/06/2018 CLINICAL DATA:  Pain following fall EXAM: CT HEAD WITHOUT CONTRAST CT CERVICAL SPINE WITHOUT CONTRAST TECHNIQUE: Multidetector CT imaging of the head and cervical spine was performed following the standard protocol without intravenous contrast. Multiplanar CT image reconstructions of the cervical spine were also generated. COMPARISON:  None. FINDINGS: CT HEAD FINDINGS Brain: The ventricles are normal in size and configuration. There is no intracranial mass, hemorrhage, extra-axial fluid collection, or midline shift. The gray-white compartments appear normal. No evident acute infarct. Vascular: No hyperdense vessel. There is no appreciable vascular calcification. Skull: The bony calvarium appears intact. Sinuses/Orbits: There are retention cysts in the maxillary antra bilaterally, larger on the right than on the left. There is opacification and mucosal thickening in several ethmoid air cells bilaterally. Other visualized paranasal sinuses are clear. Orbits appear symmetric bilaterally. Other: Mastoid air cells are clear. CT CERVICAL SPINE FINDINGS Alignment: There is no evident spondylolisthesis. Skull base and vertebrae: Skull base and craniocervical junction regions appear normal. No evident fracture. No blastic or lytic bone lesions. Soft tissues and spinal canal: Prevertebral soft tissues and predental space regions are normal. No paraspinous lesion. No evident cord or canal hematoma. Disc levels: There is no appreciable disc space narrowing. There is calcification in the anterior ligament at C4-5. There is no appreciable nerve root edema or effacement. No disc extrusion or stenosis. Upper chest: There is a sizable pneumothorax on the left. Visualized right lung is clear. Other: None IMPRESSION: CT head: No intracranial mass or hemorrhage. Gray-white  compartments appear normal. There is multifocal paranasal sinus disease. CT cervical spine: No fracture or spondylolisthesis. No appreciable nerve root edema or effacement. Sizable pneumothorax on the left. Critical Value/emergent results were called by telephone at the time of interpretation on 09/06/2018 at 2:00 pm to [facility] [place], who verbally acknowledged these results. Electronically Signed   By: Bretta Bang III M.D.   On: 09/06/2018 14:01   Ct Chest W Contrast  Result Date: 09/06/2018 CLINICAL DATA:  Left-sided abdominal pain and left flank bruising secondary to a fall from a roof today. EXAM: CT CHEST, ABDOMEN, AND PELVIS WITH CONTRAST TECHNIQUE: Multidetector CT imaging of the chest, abdomen and pelvis was performed following the standard protocol during bolus administration of intravenous contrast. CONTRAST:  ISOVUE-300 IOPAMIDOL (ISOVUE-300) INJECTION 61% COMPARISON:  None. FINDINGS: CT CHEST FINDINGS Cardiovascular: No significant vascular findings. Normal heart size. No pericardial effusion. Mediastinum/Nodes: No enlarged mediastinal, hilar, or axillary lymph nodes. Thyroid gland, trachea, and esophagus demonstrate no significant findings. Lungs/Pleura: There is a 40% left pneumothorax. There is a focal contusion of the left lower lobe adjacent to  a fracture of the lateral aspect of the left seventh rib. There is a small left pleural effusion. Right lung is clear. Musculoskeletal: There are slightly displaced fractures of the lateral aspects of the left third, 6, seventh, and eighth ribs. There are also nondisplaced fractures of the posterior aspects of the left sixth, seventh and eighth ribs. CT ABDOMEN PELVIS FINDINGS Hepatobiliary: Diffuse slight hepatic steatosis. Liver parenchyma is otherwise normal. Biliary tree is normal. Pancreas: Unremarkable. No pancreatic ductal dilatation or surrounding inflammatory changes. Spleen: No splenic injury or perisplenic hematoma. Adrenals/Urinary  Tract: Adrenal glands are unremarkable. Kidneys are normal except for a 3.9 cm simple appearing cyst on the lower pole of the left kidney. Bladder is unremarkable. Stomach/Bowel: Stomach is within normal limits. Appendix appears normal. No evidence of bowel wall thickening, distention, or inflammatory changes. Vascular/Lymphatic: No significant vascular findings are present. No enlarged abdominal or pelvic lymph nodes. Reproductive: Prostate is unremarkable. Other: Soft tissue contusion of the subcutaneous fat in the superior aspect of the left buttock just above the left posterosuperior iliac crest. Musculoskeletal: Negative. IMPRESSION: 1. 40% left pneumothorax. 2. Multiple left rib fractures as described. 3. Small focal pulmonary contusion in the left lower lobe. 4. Small left pleural effusion or hemothorax. 5. No acute intra-abdominal or intrapelvic abnormality. 6. Contusion of the subcutaneous fat of the superior aspect of the left buttock. Critical Value/emergent results were called by telephone at the time of interpretation on 09/06/2018 at 2:08 pm to Dr. Terance Hart , who verbally acknowledged these results. Electronically Signed   By: Francene Boyers M.D.   On: 09/06/2018 14:16   Ct Cervical Spine Wo Contrast  Result Date: 09/06/2018 CLINICAL DATA:  Pain following fall EXAM: CT HEAD WITHOUT CONTRAST CT CERVICAL SPINE WITHOUT CONTRAST TECHNIQUE: Multidetector CT imaging of the head and cervical spine was performed following the standard protocol without intravenous contrast. Multiplanar CT image reconstructions of the cervical spine were also generated. COMPARISON:  None. FINDINGS: CT HEAD FINDINGS Brain: The ventricles are normal in size and configuration. There is no intracranial mass, hemorrhage, extra-axial fluid collection, or midline shift. The gray-white compartments appear normal. No evident acute infarct. Vascular: No hyperdense vessel. There is no appreciable vascular calcification. Skull: The bony  calvarium appears intact. Sinuses/Orbits: There are retention cysts in the maxillary antra bilaterally, larger on the right than on the left. There is opacification and mucosal thickening in several ethmoid air cells bilaterally. Other visualized paranasal sinuses are clear. Orbits appear symmetric bilaterally. Other: Mastoid air cells are clear. CT CERVICAL SPINE FINDINGS Alignment: There is no evident spondylolisthesis. Skull base and vertebrae: Skull base and craniocervical junction regions appear normal. No evident fracture. No blastic or lytic bone lesions. Soft tissues and spinal canal: Prevertebral soft tissues and predental space regions are normal. No paraspinous lesion. No evident cord or canal hematoma. Disc levels: There is no appreciable disc space narrowing. There is calcification in the anterior ligament at C4-5. There is no appreciable nerve root edema or effacement. No disc extrusion or stenosis. Upper chest: There is a sizable pneumothorax on the left. Visualized right lung is clear. Other: None IMPRESSION: CT head: No intracranial mass or hemorrhage. Gray-white compartments appear normal. There is multifocal paranasal sinus disease. CT cervical spine: No fracture or spondylolisthesis. No appreciable nerve root edema or effacement. Sizable pneumothorax on the left. Critical Value/emergent results were called by telephone at the time of interpretation on 09/06/2018 at 2:00 pm to [facility] [place], who verbally acknowledged these results. Electronically Signed  By: Bretta Bang III M.D.   On: 09/06/2018 14:01   Ct Abdomen Pelvis W Contrast  Result Date: 09/06/2018 CLINICAL DATA:  Left-sided abdominal pain and left flank bruising secondary to a fall from a roof today. EXAM: CT CHEST, ABDOMEN, AND PELVIS WITH CONTRAST TECHNIQUE: Multidetector CT imaging of the chest, abdomen and pelvis was performed following the standard protocol during bolus administration of intravenous contrast. CONTRAST:   ISOVUE-300 IOPAMIDOL (ISOVUE-300) INJECTION 61% COMPARISON:  None. FINDINGS: CT CHEST FINDINGS Cardiovascular: No significant vascular findings. Normal heart size. No pericardial effusion. Mediastinum/Nodes: No enlarged mediastinal, hilar, or axillary lymph nodes. Thyroid gland, trachea, and esophagus demonstrate no significant findings. Lungs/Pleura: There is a 40% left pneumothorax. There is a focal contusion of the left lower lobe adjacent to a fracture of the lateral aspect of the left seventh rib. There is a small left pleural effusion. Right lung is clear. Musculoskeletal: There are slightly displaced fractures of the lateral aspects of the left third, 6, seventh, and eighth ribs. There are also nondisplaced fractures of the posterior aspects of the left sixth, seventh and eighth ribs. CT ABDOMEN PELVIS FINDINGS Hepatobiliary: Diffuse slight hepatic steatosis. Liver parenchyma is otherwise normal. Biliary tree is normal. Pancreas: Unremarkable. No pancreatic ductal dilatation or surrounding inflammatory changes. Spleen: No splenic injury or perisplenic hematoma. Adrenals/Urinary Tract: Adrenal glands are unremarkable. Kidneys are normal except for a 3.9 cm simple appearing cyst on the lower pole of the left kidney. Bladder is unremarkable. Stomach/Bowel: Stomach is within normal limits. Appendix appears normal. No evidence of bowel wall thickening, distention, or inflammatory changes. Vascular/Lymphatic: No significant vascular findings are present. No enlarged abdominal or pelvic lymph nodes. Reproductive: Prostate is unremarkable. Other: Soft tissue contusion of the subcutaneous fat in the superior aspect of the left buttock just above the left posterosuperior iliac crest. Musculoskeletal: Negative. IMPRESSION: 1. 40% left pneumothorax. 2. Multiple left rib fractures as described. 3. Small focal pulmonary contusion in the left lower lobe. 4. Small left pleural effusion or hemothorax. 5. No acute  intra-abdominal or intrapelvic abnormality. 6. Contusion of the subcutaneous fat of the superior aspect of the left buttock. Critical Value/emergent results were called by telephone at the time of interpretation on 09/06/2018 at 2:08 pm to Dr. Terance Hart , who verbally acknowledged these results. Electronically Signed   By: Francene Boyers M.D.   On: 09/06/2018 14:16   Dg Pelvis Portable  Result Date: 09/06/2018 CLINICAL DATA:  Shortness of breath EXAM: PORTABLE PELVIS 1-2 VIEWS COMPARISON:  None. FINDINGS: The bony pelvis is subjectively adequately mineralized. There is no acute or healing fracture. The visualized portions of the hips exhibit no acute abnormalities. IMPRESSION: There is no acute bony abnormality of the pelvis. Electronically Signed   By: David  [place] M.D.   On: 09/06/2018 12:56   Dg Chest Port 1 View  Result Date: 09/06/2018 CLINICAL DATA:  Pneumothorax following fall EXAM: PORTABLE CHEST 1 VIEW COMPARISON:  Chest CT and chest radiograph September 06, 2018 FINDINGS: There is a chest tube on the left. There is subcutaneous air on the left but no evident pneumothorax. There are displaced fractures of the lateral left sixth and seventh ribs. There is a nondisplaced fracture of the lateral left eighth rib. No evident edema or consolidation. Heart size and pulmonary vascularity are normal. No adenopathy. IMPRESSION: No pneumothorax following chest tube placement. There is subcutaneous air on the left. There are displaced rib fractures on the left. No edema or consolidation. Heart size normal. Electronically  Signed   By: Bretta Bang III M.D.   On: 09/06/2018 15:57   Dg Chest Port 1 View  Result Date: 09/06/2018 CLINICAL DATA:  Shortness of breath EXAM: PORTABLE CHEST 1 VIEW COMPARISON:  None in PACs FINDINGS: The lungs are mildly hypoinflated. There is hazy increased density laterally in the left mid and lower hemithorax. There are mildly displaced fractures of the lateral aspects of the  left sixth, seventh, and eighth ribs. There is deformity of the lateral aspect of the left third rib which is of uncertain age. There is no pneumothorax or pleural effusion. The heart is top-normal in size. The pulmonary vascularity is normal. The bony thorax exhibits no acute abnormality. IMPRESSION: Abnormal appearance of the sixth through eighth ribs on the left with a small amount of increased parenchymal density in the left lung. This may reflect presence of acute fractures with pulmonary contusion. There is no pneumothorax or pleural effusion. There is deformity of the lateral aspect of the left third rib which is of uncertain age. Top-normal cardiac size without pulmonary vascular congestion or pulmonary edema. Electronically Signed   By: David  [place] M.D.   On: 09/06/2018 12:56    Anti-infectives: Anti-infectives (From admission, onward)   None       Assessment/Plan Fall from 1 story Multiple Left sided rib fractures with L hptx and pulmonary contusion - chest tube, pulm toilet, pain control - repeat CXR without PTX - continue CT on suction today, repeat CXR in AM - NEEDS IS!!! L buttock contusion - H/H 13.5/41.4, monitor CBC L elbow pain - negative for fracture, PT/OT Cervical pain - flex/ex negative, collar off  FEN: reg, IVF VTE: SCDs, lovenox  ID: no abx indicated  Dispo: chest tube. PT/OT, pain control. Incentive spirometry  LOS: 1 day    Wells Guiles , [facility] 09/07/2018, 8:12 AM Pager: [phone] Trauma Pager: [phone] Mon-Fri 7:00 am-4:30 pm Sat-Sun 7:00 am-11:30 am

## 2018-09-08 ENCOUNTER — Other Ambulatory Visit: Payer: Self-pay

## 2018-09-08 ENCOUNTER — Inpatient Hospital Stay (HOSPITAL_COMMUNITY): Payer: Self-pay

## 2018-09-08 LAB — BASIC METABOLIC PANEL
Anion gap: 10 (ref 5–15)
BUN: 8 mg/dL (ref 6–20)
CO2: 24 mmol/L (ref 22–32)
Calcium: 8.4 mg/dL — ABNORMAL LOW (ref 8.9–10.3)
Chloride: 104 mmol/L (ref 98–111)
Creatinine, Ser: 0.9 mg/dL (ref 0.61–1.24)
GFR calc Af Amer: >60 mL/min (ref >60)
GFR calc non Af Amer: >60 mL/min (ref >60)
GLUCOSE: 99 mg/dL (ref 70–99)
Potassium: 3.6 mmol/L (ref 3.5–5.1)
Sodium: 138 mmol/L (ref 135–145)

## 2018-09-08 LAB — CBC
HCT: 41 % (ref 39.0–52.0)
HEMOGLOBIN: 13.4 g/dL (ref 13.0–17.0)
MCH: 29.6 pg (ref 26.0–34.0)
MCHC: 32.7 g/dL (ref 30.0–36.0)
MCV: 90.5 fL (ref 78.0–100.0)
Platelets: 171 10*3/uL (ref 150–400)
RBC: 4.53 MIL/uL (ref 4.22–5.81)
RDW: 13.3 % (ref 11.5–15.5)
WBC: 7.2 10*3/uL (ref 4.0–10.5)

## 2018-09-08 MED ORDER — POLYETHYLENE GLYCOL 3350 17 G PO PACK
17.0000 g | PACK | Freq: Once | ORAL | Status: AC
  Administered 2018-09-08: 17 g via ORAL
  Filled 2018-09-08: qty 1

## 2018-09-08 NOTE — Plan of Care (Signed)
?  Problem: Clinical Measurements: ?Goal: Ability to maintain clinical measurements within normal limits will improve ?Outcome: Progressing ?Goal: Will remain free from infection ?Outcome: Progressing ?Goal: Diagnostic test results will improve ?Outcome: Progressing ?  ?

## 2018-09-08 NOTE — Progress Notes (Signed)
[facility] Progress Note     Subjective: CC: left chest pain Pain in left rib cage is improving but still present. Patient has not taken any IV pain medication in the last 24 hrs. Breathing feels improved, pulling 500 on IS. Tolerating diet, passing flatus, no BM. Abdominal pain is improved. L elbow pain improving. Patient hopeful to go home soon.   Objective: Vital signs in last 24 hours: Temp:  [98.3 F (36.8 C)-98.8 F (37.1 C)] 98.3 F (36.8 C) (09/11 0507) Pulse Rate:  [78-89] 78 (09/11 0507) Resp:  [17-18] 17 (09/11 0507) BP: (121-136)/(81-93) 125/85 (09/11 0507) SpO2:  [94 %-100 %] 94 % (09/11 0507) Last BM Date: 09/06/18  Intake/Output from previous day: 09/10 0701 - 09/11 0700 In: 1578.3 [P.O.:360; I.V.:1218.3] Out: 460 [Urine:450; Chest Tube:10] Intake/Output this shift: No intake/output data recorded.  PE: Gen:  Alert, NAD, pleasant Card:  Regular rate and rhythm, pedal pulses 2+ BL Pulm:  Normal effort, clear to auscultation bilaterally; CT without air leak and minimal sanguinous drainage Abd: Soft, non-tender, non-distended, bowel sounds present, no HSM Ext: mild bruising to L buttock that is mildly TTP Skin: warm and dry, no rashes  Psych: A&Ox3   Lab Results:  Recent Labs    09/07/18 0550 09/08/18 0611  WBC 8.9 7.2  HGB 13.5 13.4  HCT 41.4 41.0  PLT 183 171   BMET Recent Labs    09/07/18 0550 09/08/18 0611  NA 138 138  K 3.6 3.6  CL 105 104  CO2 22 24  GLUCOSE 105* 99  BUN 9 8  CREATININE 0.84 0.90  CALCIUM 8.2* 8.4*   PT/INR Recent Labs    09/06/18 1222  LABPROT 13.7  INR 1.06   CMP     Component Value Date/Time   NA 138 09/08/2018 0611   K 3.6 09/08/2018 0611   CL 104 09/08/2018 0611   CO2 24 09/08/2018 0611   GLUCOSE 99 09/08/2018 0611   BUN 8 09/08/2018 0611   CREATININE 0.90 09/08/2018 0611   CALCIUM 8.4 (L) 09/08/2018 0611   PROT 7.1 09/06/2018 1222   ALBUMIN 4.2 09/06/2018 1222   AST 39 09/06/2018 1222    ALT 37 09/06/2018 1222   ALKPHOS 57 09/06/2018 1222   BILITOT 0.5 09/06/2018 1222   GFRNONAA >60 09/08/2018 0611   GFRAA >60 09/08/2018 0611   Lipase  No results found for: LIPASE     Studies/Results: Dg Cervical Spine With Flex & Extend  Result Date: 09/06/2018 CLINICAL DATA:  Fall from house. EXAM: CERVICAL SPINE COMPLETE WITH FLEXION AND EXTENSION VIEWS COMPARISON:  None. FINDINGS: The alignment appears normal. No fractures identified. No significant listhesis between the flexion and extension images. IMPRESSION: Negative exam. Electronically Signed   By: Signa Kell M.D.   On: 09/06/2018 18:10   Dg Elbow Complete Left (3+view)  Result Date: 09/06/2018 CLINICAL DATA:  Left arm pain EXAM: LEFT ELBOW - COMPLETE 3+ VIEW COMPARISON:  None. FINDINGS: No definite acute fracture or dislocation. No evidence of joint effusion. Several radiopaque punctate and linear opacities project over the soft tissues of unknown significance. IMPRESSION: No acute bony pathology. Electronically Signed   By: Jolaine Click M.D.   On: 09/06/2018 18:11   Ct Head Wo Contrast  Result Date: 09/06/2018 CLINICAL DATA:  Pain following fall EXAM: CT HEAD WITHOUT CONTRAST CT CERVICAL SPINE WITHOUT CONTRAST TECHNIQUE: Multidetector CT imaging of the head and cervical spine was performed following the standard protocol without intravenous contrast. Multiplanar CT  image reconstructions of the cervical spine were also generated. COMPARISON:  None. FINDINGS: CT HEAD FINDINGS Brain: The ventricles are normal in size and configuration. There is no intracranial mass, hemorrhage, extra-axial fluid collection, or midline shift. The gray-white compartments appear normal. No evident acute infarct. Vascular: No hyperdense vessel. There is no appreciable vascular calcification. Skull: The bony calvarium appears intact. Sinuses/Orbits: There are retention cysts in the maxillary antra bilaterally, larger on the right than on the left.  There is opacification and mucosal thickening in several ethmoid air cells bilaterally. Other visualized paranasal sinuses are clear. Orbits appear symmetric bilaterally. Other: Mastoid air cells are clear. CT CERVICAL SPINE FINDINGS Alignment: There is no evident spondylolisthesis. Skull base and vertebrae: Skull base and craniocervical junction regions appear normal. No evident fracture. No blastic or lytic bone lesions. Soft tissues and spinal canal: Prevertebral soft tissues and predental space regions are normal. No paraspinous lesion. No evident cord or canal hematoma. Disc levels: There is no appreciable disc space narrowing. There is calcification in the anterior ligament at C4-5. There is no appreciable nerve root edema or effacement. No disc extrusion or stenosis. Upper chest: There is a sizable pneumothorax on the left. Visualized right lung is clear. Other: None IMPRESSION: CT head: No intracranial mass or hemorrhage. Gray-white compartments appear normal. There is multifocal paranasal sinus disease. CT cervical spine: No fracture or spondylolisthesis. No appreciable nerve root edema or effacement. Sizable pneumothorax on the left. Critical Value/emergent results were called by telephone at the time of interpretation on 09/06/2018 at 2:00 pm to [facility] [place], who verbally acknowledged these results. Electronically Signed   By: Bretta Bang III M.D.   On: 09/06/2018 14:01   Ct Chest W Contrast  Result Date: 09/06/2018 CLINICAL DATA:  Left-sided abdominal pain and left flank bruising secondary to a fall from a roof today. EXAM: CT CHEST, ABDOMEN, AND PELVIS WITH CONTRAST TECHNIQUE: Multidetector CT imaging of the chest, abdomen and pelvis was performed following the standard protocol during bolus administration of intravenous contrast. CONTRAST:  ISOVUE-300 IOPAMIDOL (ISOVUE-300) INJECTION 61% COMPARISON:  None. FINDINGS: CT CHEST FINDINGS Cardiovascular: No significant vascular findings.  Normal heart size. No pericardial effusion. Mediastinum/Nodes: No enlarged mediastinal, hilar, or axillary lymph nodes. Thyroid gland, trachea, and esophagus demonstrate no significant findings. Lungs/Pleura: There is a 40% left pneumothorax. There is a focal contusion of the left lower lobe adjacent to a fracture of the lateral aspect of the left seventh rib. There is a small left pleural effusion. Right lung is clear. Musculoskeletal: There are slightly displaced fractures of the lateral aspects of the left third, 6, seventh, and eighth ribs. There are also nondisplaced fractures of the posterior aspects of the left sixth, seventh and eighth ribs. CT ABDOMEN PELVIS FINDINGS Hepatobiliary: Diffuse slight hepatic steatosis. Liver parenchyma is otherwise normal. Biliary tree is normal. Pancreas: Unremarkable. No pancreatic ductal dilatation or surrounding inflammatory changes. Spleen: No splenic injury or perisplenic hematoma. Adrenals/Urinary Tract: Adrenal glands are unremarkable. Kidneys are normal except for a 3.9 cm simple appearing cyst on the lower pole of the left kidney. Bladder is unremarkable. Stomach/Bowel: Stomach is within normal limits. Appendix appears normal. No evidence of bowel wall thickening, distention, or inflammatory changes. Vascular/Lymphatic: No significant vascular findings are present. No enlarged abdominal or pelvic lymph nodes. Reproductive: Prostate is unremarkable. Other: Soft tissue contusion of the subcutaneous fat in the superior aspect of the left buttock just above the left posterosuperior iliac crest. Musculoskeletal: Negative. IMPRESSION: 1. 40% left pneumothorax.  2. Multiple left rib fractures as described. 3. Small focal pulmonary contusion in the left lower lobe. 4. Small left pleural effusion or hemothorax. 5. No acute intra-abdominal or intrapelvic abnormality. 6. Contusion of the subcutaneous fat of the superior aspect of the left buttock. Critical Value/emergent results  were called by telephone at the time of interpretation on 09/06/2018 at 2:08 pm to Dr. Terance Hart , who verbally acknowledged these results. Electronically Signed   By: Francene Boyers M.D.   On: 09/06/2018 14:16   Ct Cervical Spine Wo Contrast  Result Date: 09/06/2018 CLINICAL DATA:  Pain following fall EXAM: CT HEAD WITHOUT CONTRAST CT CERVICAL SPINE WITHOUT CONTRAST TECHNIQUE: Multidetector CT imaging of the head and cervical spine was performed following the standard protocol without intravenous contrast. Multiplanar CT image reconstructions of the cervical spine were also generated. COMPARISON:  None. FINDINGS: CT HEAD FINDINGS Brain: The ventricles are normal in size and configuration. There is no intracranial mass, hemorrhage, extra-axial fluid collection, or midline shift. The gray-white compartments appear normal. No evident acute infarct. Vascular: No hyperdense vessel. There is no appreciable vascular calcification. Skull: The bony calvarium appears intact. Sinuses/Orbits: There are retention cysts in the maxillary antra bilaterally, larger on the right than on the left. There is opacification and mucosal thickening in several ethmoid air cells bilaterally. Other visualized paranasal sinuses are clear. Orbits appear symmetric bilaterally. Other: Mastoid air cells are clear. CT CERVICAL SPINE FINDINGS Alignment: There is no evident spondylolisthesis. Skull base and vertebrae: Skull base and craniocervical junction regions appear normal. No evident fracture. No blastic or lytic bone lesions. Soft tissues and spinal canal: Prevertebral soft tissues and predental space regions are normal. No paraspinous lesion. No evident cord or canal hematoma. Disc levels: There is no appreciable disc space narrowing. There is calcification in the anterior ligament at C4-5. There is no appreciable nerve root edema or effacement. No disc extrusion or stenosis. Upper chest: There is a sizable pneumothorax on the left.  Visualized right lung is clear. Other: None IMPRESSION: CT head: No intracranial mass or hemorrhage. Gray-white compartments appear normal. There is multifocal paranasal sinus disease. CT cervical spine: No fracture or spondylolisthesis. No appreciable nerve root edema or effacement. Sizable pneumothorax on the left. Critical Value/emergent results were called by telephone at the time of interpretation on 09/06/2018 at 2:00 pm to [facility] [place], who verbally acknowledged these results. Electronically Signed   By: Bretta Bang III M.D.   On: 09/06/2018 14:01   Ct Abdomen Pelvis W Contrast  Result Date: 09/06/2018 CLINICAL DATA:  Left-sided abdominal pain and left flank bruising secondary to a fall from a roof today. EXAM: CT CHEST, ABDOMEN, AND PELVIS WITH CONTRAST TECHNIQUE: Multidetector CT imaging of the chest, abdomen and pelvis was performed following the standard protocol during bolus administration of intravenous contrast. CONTRAST:  ISOVUE-300 IOPAMIDOL (ISOVUE-300) INJECTION 61% COMPARISON:  None. FINDINGS: CT CHEST FINDINGS Cardiovascular: No significant vascular findings. Normal heart size. No pericardial effusion. Mediastinum/Nodes: No enlarged mediastinal, hilar, or axillary lymph nodes. Thyroid gland, trachea, and esophagus demonstrate no significant findings. Lungs/Pleura: There is a 40% left pneumothorax. There is a focal contusion of the left lower lobe adjacent to a fracture of the lateral aspect of the left seventh rib. There is a small left pleural effusion. Right lung is clear. Musculoskeletal: There are slightly displaced fractures of the lateral aspects of the left third, 6, seventh, and eighth ribs. There are also nondisplaced fractures of the posterior aspects of the left  sixth, seventh and eighth ribs. CT ABDOMEN PELVIS FINDINGS Hepatobiliary: Diffuse slight hepatic steatosis. Liver parenchyma is otherwise normal. Biliary tree is normal. Pancreas: Unremarkable. No pancreatic  ductal dilatation or surrounding inflammatory changes. Spleen: No splenic injury or perisplenic hematoma. Adrenals/Urinary Tract: Adrenal glands are unremarkable. Kidneys are normal except for a 3.9 cm simple appearing cyst on the lower pole of the left kidney. Bladder is unremarkable. Stomach/Bowel: Stomach is within normal limits. Appendix appears normal. No evidence of bowel wall thickening, distention, or inflammatory changes. Vascular/Lymphatic: No significant vascular findings are present. No enlarged abdominal or pelvic lymph nodes. Reproductive: Prostate is unremarkable. Other: Soft tissue contusion of the subcutaneous fat in the superior aspect of the left buttock just above the left posterosuperior iliac crest. Musculoskeletal: Negative. IMPRESSION: 1. 40% left pneumothorax. 2. Multiple left rib fractures as described. 3. Small focal pulmonary contusion in the left lower lobe. 4. Small left pleural effusion or hemothorax. 5. No acute intra-abdominal or intrapelvic abnormality. 6. Contusion of the subcutaneous fat of the superior aspect of the left buttock. Critical Value/emergent results were called by telephone at the time of interpretation on 09/06/2018 at 2:08 pm to Dr. Terance Hart , who verbally acknowledged these results. Electronically Signed   By: Francene Boyers M.D.   On: 09/06/2018 14:16   Dg Pelvis Portable  Result Date: 09/06/2018 CLINICAL DATA:  Shortness of breath EXAM: PORTABLE PELVIS 1-2 VIEWS COMPARISON:  None. FINDINGS: The bony pelvis is subjectively adequately mineralized. There is no acute or healing fracture. The visualized portions of the hips exhibit no acute abnormalities. IMPRESSION: There is no acute bony abnormality of the pelvis. Electronically Signed   By: David  [place] M.D.   On: 09/06/2018 12:56   Dg Chest Port 1 View  Result Date: 09/08/2018 CLINICAL DATA:  Chest tube placement for pneumothorax EXAM: PORTABLE CHEST 1 VIEW COMPARISON:  September 07, 2018 FINDINGS:  Pigtail catheter on the left is unchanged in position. There is no pneumothorax. There is subcutaneous air on the left. There is atelectatic change in the left base. Lungs elsewhere clear. Heart is mildly enlarged with pulmonary vascularity normal. No adenopathy. Several displaced rib fractures on the left are stable. IMPRESSION: No pneumothorax on the left with chest tube in place. There is subcutaneous air on the left. There are displaced rib fractures on the left. There is atelectatic change in the left base. Right lung is clear. Stable cardiac prominence. Electronically Signed   By: Bretta Bang III M.D.   On: 09/08/2018 07:33   Dg Chest Port 1 View  Result Date: 09/07/2018 CLINICAL DATA:  Follow-up left-sided pneumothorax. EXAM: PORTABLE CHEST 1 VIEW COMPARISON:  Portable chest x-ray of September 06, 2018 FINDINGS: The small caliber left chest tube remains in place in the left pulmonary apex. No pneumothorax is evident. Both lungs are mildly hypoinflated. There are coarse lung markings in the left retrocardiac region. The cardiac silhouette remains enlarged and the central pulmonary vascularity remains engorged. There is subcutaneous emphysema in the left axillary region. IMPRESSION: No left-sided pneumothorax is visible. The pigtail catheter remains in place in the left pulmonary apex. CHF with mild pulmonary vascular congestion. Left lower lobe atelectasis or pneumonia. Electronically Signed   By: David  [place] M.D.   On: 09/07/2018 09:24   Dg Chest Port 1 View  Result Date: 09/06/2018 CLINICAL DATA:  Pneumothorax following fall EXAM: PORTABLE CHEST 1 VIEW COMPARISON:  Chest CT and chest radiograph September 06, 2018 FINDINGS: There is a chest tube on the  left. There is subcutaneous air on the left but no evident pneumothorax. There are displaced fractures of the lateral left sixth and seventh ribs. There is a nondisplaced fracture of the lateral left eighth rib. No evident edema or consolidation.  Heart size and pulmonary vascularity are normal. No adenopathy. IMPRESSION: No pneumothorax following chest tube placement. There is subcutaneous air on the left. There are displaced rib fractures on the left. No edema or consolidation. Heart size normal. Electronically Signed   By: Bretta Bang III M.D.   On: 09/06/2018 15:57   Dg Chest Port 1 View  Result Date: 09/06/2018 CLINICAL DATA:  Shortness of breath EXAM: PORTABLE CHEST 1 VIEW COMPARISON:  None in PACs FINDINGS: The lungs are mildly hypoinflated. There is hazy increased density laterally in the left mid and lower hemithorax. There are mildly displaced fractures of the lateral aspects of the left sixth, seventh, and eighth ribs. There is deformity of the lateral aspect of the left third rib which is of uncertain age. There is no pneumothorax or pleural effusion. The heart is top-normal in size. The pulmonary vascularity is normal. The bony thorax exhibits no acute abnormality. IMPRESSION: Abnormal appearance of the sixth through eighth ribs on the left with a small amount of increased parenchymal density in the left lung. This may reflect presence of acute fractures with pulmonary contusion. There is no pneumothorax or pleural effusion. There is deformity of the lateral aspect of the left third rib which is of uncertain age. Top-normal cardiac size without pulmonary vascular congestion or pulmonary edema. Electronically Signed   By: David  [place] M.D.   On: 09/06/2018 12:56    Anti-infectives: Anti-infectives (From admission, onward)   None       Assessment/Plan Fall from 1 story Multiple Left sided rib fractures with L hptx and pulmonary contusion- chest tube, pulm toilet, pain control, IS - repeat CXR without PTX - CT to WS, repeat CXR in AM L buttock contusion- H/H stable, heat/ice prn L elbow pain- negative for fracture, improving Cervical pain- flex/ex negative, collar off  FEN: reg, IVF VTE: SCDs, lovenox  ID: no abx  indicated  Dispo: chest tube to water seal, repeat CXR in AM. May be ready for d/c in the next 24-48 hrs  LOS: 2 days    Wells Guiles , [facility] 09/08/2018, 8:55 AM Pager: [phone] Trauma Pager: [phone] Mon-Fri 7:00 am-4:30 pm Sat-Sun 7:00 am-11:30 am

## 2018-09-08 NOTE — Progress Notes (Signed)
CSW met with patient via bedside to complete SBIRT- patient speaks spanish WALL-E interpreting service used. Patient was pleasant and appropriate during conversation. Patient currently lives at home with his spouse who was present in room. Patient states he has no concerns with returning home at this time and states he is pretty stable on his feet. PT has seen patient and are recommending no follow up at this time. Patient states he does not smoke/ drink/ do drugs and has no concerns for alcohol or substance abuse at this time. Patient did have questions regarding insurance/ bills- patient currently has no insurance. Patient stated he had a visitor( potentially someone from financial counseling) meet with him and complete a Medicaid screening to determine his eligibility- if this was completed a Medicaid application should be pending. Patient had no other concerns at this time. SBIRT completed in flowsheets.   Kingsley Spittle, [place]  [phone]

## 2018-09-08 NOTE — Discharge Instructions (Signed)
Neumotórax  (Pneumothorax)  Un neumotórax, comúnmente llamado pulmón colapsado, es una afección en la que se filtra aire de un pulmón y se acumula en el espacio entre el pulmón y la pared torácica (espacio pleural). El aire en un neumotórax está atrapado fuera del pulmón y ocupa espacio, y esto le impide al pulmón expandirse por completo. Este problema suele aparecer rápidamente. La acumulación de aire puede ser pequeña o grande. Un neumotórax pequeño puede desaparecer solo. Cuando un neumotórax es más grande suele necesitar tratamiento médico y hospitalización.  CAUSAS  A veces, un neumotórax puede formarse rápidamente sin causa aparente. Las personas que tienen problemas de pulmón subyacentes, en particular EPOC o enfisema, tienen un riesgo mayor de tener un neumotórax. No obstante, un neumotórax puede formarse rápidamente incluso en personas sin problemas pulmonares conocidos. Los traumatismos, cirugías, procedimientos médicos o lesiones en la pared torácica también pueden provocar un neumotórax.  SIGNOS Y SÍNTOMAS  En ocasiones, el neumotórax no tiene síntomas. Si se presentan síntomas, estos pueden ser:  · Dolor en el pecho.  · Falta de aire.  · Frecuencia respiratoria aumentada.  · Color azulado en los labios o la piel (cianosis).  DIAGNÓSTICO  Por lo general, el neumotórax se diagnostica mediante una radiografía o una tomografía computada de tórax. El médico le hará una historia clínica y un examen físico para determinar por qué puede tener un neumotórax.  TRATAMIENTO  Un neumotórax pequeño puede desaparecer solo sin tratamiento. A veces, oxígeno extra puede colaborar para que un neumotórax pequeño desparezca con mayor rapidez. En el caso de un neumotórax más grande o que causa síntomas, suele ser necesario un procedimiento para drenar el aire. En algunos casos, el médico puede drenar el aire utilizando una aguja. En otros, se puede introducir un tubo pleural en el espacio pleural. Un tubo pleural es un tubo  pequeño que se coloca entre las costillas en el espacio pleural. El tubo elimina el aire extra y le permite al pulmón volver a expandirse hasta su tamaño normal. Un neumotórax grande, por lo general, necesita hospitalización. Si existe filtración de aire constante en el espacio pleural, es posible que el tubo pleural se deba dejar colocado durante varios días hasta solucionar el problema. En algunos casos, podría necesitarse una cirugía.  INSTRUCCIONES PARA EL CUIDADO EN EL HOGAR  · Tome solo medicamentos de venta libre o recetados, según las indicaciones del médico.  · Si el dolor o la tos le dificultan el sueño por la noche, trate de dormir en posición semierguida en una reposera o usando dos o tres almohadas.  · Limite las actividades según las indicaciones del médico.  · Si le habían colocado un tubo pleural y este fue retirado, consulte con su médico cuándo es el mejor momento para retirar el vendaje. Hasta que el médico le diga que puede retirar el vendaje, no permita que se moje.  · No fume. Fumar es un factor de riesgo para el neumotórax.  · No viaje en avión ni nade bajo el agua hasta que su médico le permita hacerlo.  · Concurra a las consultas de control con su médico según las indicaciones.  SOLICITE ATENCIÓN MÉDICA DE INMEDIATO SI:  · Siente falta de aire o dolor en el pecho cada vez más intenso.  · Tiene una tos que no se puede controlar con supresores.  · Comienza a escupir sangre al toser.  · Siente un dolor que va en aumento o que no puede controlar con los medicamentos.  · Elimina   pleural (en caso de que se haya tratado el neumotrax con un tubo pleural).  Se abre el lugar donde se coloc el tubo pleural.  Siente que sale aire del lugar donde se coloc el tubo pleural.  Tiene fiebre o sntomas persistentes durante ms de 2a  3das.  Tiene fiebre y los sntomas empeoran repentinamente. ASEGRESE DE QUE:  Comprende estas instrucciones.  Controlar su afeccin.  Recibir ayuda de inmediato si no mejora o si empeora. Esta informacin no tiene Theme park manager el consejo del mdico. Asegrese de hacerle al mdico cualquier pregunta que tenga. Document Released: 09/24/2005 Document Revised: 10/05/2013 Document Reviewed: 05/10/2014 Elsevier Interactive Patient Education  [employer].

## 2018-09-08 NOTE — Plan of Care (Signed)

## 2018-09-09 ENCOUNTER — Inpatient Hospital Stay (HOSPITAL_COMMUNITY): Payer: Self-pay

## 2018-09-09 LAB — BASIC METABOLIC PANEL
Anion gap: 10 (ref 5–15)
BUN: 10 mg/dL (ref 6–20)
CO2: 26 mmol/L (ref 22–32)
Calcium: 8.8 mg/dL — ABNORMAL LOW (ref 8.9–10.3)
Chloride: 103 mmol/L (ref 98–111)
Creatinine, Ser: 0.92 mg/dL (ref 0.61–1.24)
GFR calc non Af Amer: >60 mL/min (ref >60)
GLUCOSE: 94 mg/dL (ref 70–99)
Potassium: 3.5 mmol/L (ref 3.5–5.1)
Sodium: 139 mmol/L (ref 135–145)

## 2018-09-09 MED ORDER — ACETAMINOPHEN 500 MG PO TABS: 1000.0000 mg | ORAL_TABLET | Freq: Four times a day (QID) | ORAL | 0 refills | Status: AC | PRN

## 2018-09-09 MED ORDER — DOCUSATE SODIUM 100 MG PO CAPS: 100.0000 mg | ORAL_CAPSULE | Freq: Two times a day (BID) | ORAL | 0 refills | Status: AC

## 2018-09-09 MED ORDER — METHOCARBAMOL 500 MG PO TABS: 1000.0000 mg | ORAL_TABLET | Freq: Three times a day (TID) | ORAL | 0 refills | Status: AC | PRN

## 2018-09-09 MED ORDER — HYDROMORPHONE HCL 1 MG/ML IJ SOLN: 1.0000 mg | INTRAMUSCULAR | Status: DC | PRN

## 2018-09-09 MED ORDER — OXYCODONE HCL 5 MG PO TABS: 5.0000 mg | ORAL_TABLET | Freq: Four times a day (QID) | ORAL | 0 refills | Status: AC | PRN

## 2018-09-09 NOTE — Progress Notes (Signed)
[facility] Progress Note     Subjective: CC-  Sitting up in bed, wife at bedside. States that he is still very sore from rib fractures, but pain is improving. Pulling up to 1250 on IS. Denies SOB. CXR today stable without PNX.  Objective: Vital signs in last 24 hours: Temp:  [98 F (36.7 C)-99.3 F (37.4 C)] 98.3 F (36.8 C) (09/12 0809) Pulse Rate:  [69-96] 74 (09/12 0809) Resp:  [16-18] 18 (09/12 0554) BP: (130-146)/(82-95) 141/91 (09/12 0809) SpO2:  [97 %-100 %] 98 % (09/12 0809) Last BM Date: 09/08/18  Intake/Output from previous day: 09/11 0701 - 09/12 0700 In: 1948.5 [P.O.:660; I.V.:1288.5] Out: -  Intake/Output this shift: No intake/output data recorded.  PE: Gen:  Alert, NAD, pleasant HEENT: EOM's intact, pupils equal and round Card:  RRR Pulm:  CTAB, no W/R/R, effort normal, minimal output from chest tube and there is no air leak Abd: Soft, NT/ND, +BS Ext:  Calves soft and nontender Psych: A&Ox3  Skin: no rashes noted, warm and dry  Lab Results:  Recent Labs    09/07/18 0550 09/08/18 0611  WBC 8.9 7.2  HGB 13.5 13.4  HCT 41.4 41.0  PLT 183 171   BMET Recent Labs    09/08/18 0611 09/09/18 0444  NA 138 139  K 3.6 3.5  CL 104 103  CO2 24 26  GLUCOSE 99 94  BUN 8 10  CREATININE 0.90 0.92  CALCIUM 8.4* 8.8*   PT/INR Recent Labs    09/06/18 1222  LABPROT 13.7  INR 1.06   CMP     Component Value Date/Time   NA 139 09/09/2018 0444   K 3.5 09/09/2018 0444   CL 103 09/09/2018 0444   CO2 26 09/09/2018 0444   GLUCOSE 94 09/09/2018 0444   BUN 10 09/09/2018 0444   CREATININE 0.92 09/09/2018 0444   CALCIUM 8.8 (L) 09/09/2018 0444   PROT 7.1 09/06/2018 1222   ALBUMIN 4.2 09/06/2018 1222   AST 39 09/06/2018 1222   ALT 37 09/06/2018 1222   ALKPHOS 57 09/06/2018 1222   BILITOT 0.5 09/06/2018 1222   GFRNONAA >60 09/09/2018 0444   GFRAA >60 09/09/2018 0444   Lipase  No results found for: LIPASE     Studies/Results: Dg  Chest Port 1 View  Result Date: 09/09/2018 CLINICAL DATA:  Left side chest tube EXAM: PORTABLE CHEST 1 VIEW COMPARISON:  09/08/2018 FINDINGS: Left pigtail chest tube in place. No pneumothorax. Subcutaneous emphysema again noted in the left mid and lower chest wall laterally. Mild cardiomegaly. Bibasilar atelectasis, improved since prior study. No visible significant effusion. IMPRESSION: Left chest tube remains in place without visible pneumothorax. Stable subcutaneous emphysema. Mild cardiomegaly.  Bibasilar atelectasis, improved. Electronically Signed   By: Charlett NoseKevin  Dover M.D.   On: 09/09/2018 07:33   Dg Chest Port 1 View  Result Date: 09/08/2018 CLINICAL DATA:  Chest tube placement for pneumothorax EXAM: PORTABLE CHEST 1 VIEW COMPARISON:  September 07, 2018 FINDINGS: Pigtail catheter on the left is unchanged in position. There is no pneumothorax. There is subcutaneous air on the left. There is atelectatic change in the left base. Lungs elsewhere clear. Heart is mildly enlarged with pulmonary vascularity normal. No adenopathy. Several displaced rib fractures on the left are stable. IMPRESSION: No pneumothorax on the left with chest tube in place. There is subcutaneous air on the left. There are displaced rib fractures on the left. There is atelectatic change in the left base. Right lung is clear. Stable  cardiac prominence. Electronically Signed   By: Bretta Bang III M.D.   On: 09/08/2018 07:33    Anti-infectives: Anti-infectives (From admission, onward)   None       Assessment/Plan Fall from 1 story Multiple Left sided rib fractures with L hptx and pulmonary contusion- pulm toilet, pain control, IS - repeat CXRwithout PTX - d/c CT now and repeat CXR at 1200 L buttock contusion-H/H stable, heat/ice prn L elbow pain-negative for fracture, improving Cervical pain- flex/exnegative, collar off  FEN:reg, d/c IVF VTE: SCDs, lovenox  ID: no abx indicated  Dispo: Chest tube  removed. Repeat CXR at 1200, if stable without PNX will plan discharge this afternoon.   LOS: 3 days    Franne Forts , [facility] 09/09/2018, 8:52 AM Pager: [phone] Consults: [phone] Mon 7:00 am -11:30 AM Tues-Fri 7:00 am-4:30 pm Sat-Sun 7:00 am-11:30 am

## 2018-09-09 NOTE — Care Management Note (Signed)
Case Management Note  Patient Details  Name: Douglas Ayers MRN: [id number] Date of Birth: 02/28/1976  Subjective/Objective:                    Action/Plan:  MATCH letter provided and explained via interpreter Douglas Ayers . Patient voiced understanding.  Expected Discharge Date:  09/09/18               Expected Discharge Plan:  Home/Self Care  In-House Referral:  Financial Counselor  Discharge planning Services  CM Consult, Indigent Health Clinic, Medication Assistance, [facility]  Post Acute Care Choice:    Choice offered to:  Patient, Spouse  DME Arranged:  N/A DME Agency:  NA  HH Arranged:  NA HH Agency:  NA  Status of Service:  Completed, signed off  If discussed at Long Length of Stay Meetings, dates discussed:    Additional Comments:  Kingsley PlanWile, Lima Chillemi Marie, RN 09/09/2018, 2:40 PM

## 2018-09-09 NOTE — Discharge Summary (Signed)
[facility] Discharge Summary   Patient ID: Douglas Ayers MRN: [id number] DOB/AGE: 07-13-1976 42 y.o.  Admit date: 09/06/2018 Discharge date: 09/09/2018  Admitting Diagnosis: Fall from 1 story Multiple Left sided rib fractures with L hptx and pulmonary contusion L buttock contusion  L elbow pain Cervical pain  Discharge Diagnosis Patient Active Problem List   Diagnosis Date Noted  . Fall from roof 09/06/2018    Consultants None  Imaging: Dg Chest Port 1 View  Result Date: 09/09/2018 CLINICAL DATA:  Noon image for ct removal, pt states no complaints of sob, just some pain at left side EXAM: PORTABLE CHEST 1 VIEW COMPARISON:  09/09/2018 FINDINGS: Small bore LEFT-sided chest tube has been removed. There is no evidence for pneumothorax. Numerous LEFT rib fractures are again identified. There is persistent subcutaneous gas in LEFT hemithorax. The RIGHT lung is clear. IMPRESSION: Interval removal of chest tube.  No pneumothorax. Numerous acute fractures. Electronically Signed   By: Norva Pavlov M.D.   On: 09/09/2018 12:54   Dg Chest Port 1 View  Result Date: 09/09/2018 CLINICAL DATA:  Left side chest tube EXAM: PORTABLE CHEST 1 VIEW COMPARISON:  09/08/2018 FINDINGS: Left pigtail chest tube in place. No pneumothorax. Subcutaneous emphysema again noted in the left mid and lower chest wall laterally. Mild cardiomegaly. Bibasilar atelectasis, improved since prior study. No visible significant effusion. IMPRESSION: Left chest tube remains in place without visible pneumothorax. Stable subcutaneous emphysema. Mild cardiomegaly.  Bibasilar atelectasis, improved. Electronically Signed   By: Charlett Nose M.D.   On: 09/09/2018 07:33   Dg Chest Port 1 View  Result Date: 09/08/2018 CLINICAL DATA:  Chest tube placement for pneumothorax EXAM: PORTABLE CHEST 1 VIEW COMPARISON:  September 07, 2018 FINDINGS: Pigtail catheter on the left is unchanged in position. There is no  pneumothorax. There is subcutaneous air on the left. There is atelectatic change in the left base. Lungs elsewhere clear. Heart is mildly enlarged with pulmonary vascularity normal. No adenopathy. Several displaced rib fractures on the left are stable. IMPRESSION: No pneumothorax on the left with chest tube in place. There is subcutaneous air on the left. There are displaced rib fractures on the left. There is atelectatic change in the left base. Right lung is clear. Stable cardiac prominence. Electronically Signed   By: Bretta Bang III M.D.   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  Does not remember falling, thinks he may have +LOC. Complains of left sided chest and abdominal pain. Workup showed Multiple Left sided rib fractures with L hptx, pulmonary contusion, and left buttock contusion. Left chest tube was placed. Patient was admitted to the trauma service.  Serial chest xrays were followed and once pneumothorax resolved the chest tube was removed. Patient worked with therapies during this admission. On 9/12 the patient was tolerating diet, ambulating well, pain well controlled, vital signs stable and felt stable for discharge home.  Patient will follow up as below and knows to call with questions or concerns.    I have personally reviewed the patients medication history on the [place] controlled substance database.     Allergies as of 09/09/2018      Reactions   Penicillins Other (See Comments)   Dizziness  Has patient had a PCN reaction causing immediate rash, facial/tongue/throat swelling, SOB or lightheadedness with hypotension:  No Has patient had a PCN reaction causing severe rash involving mucus membranes or skin necrosis: No Has patient had a PCN reaction that required hospitalization: No Has patient had a PCN  reaction occurring within the last 10 years: No If all of the above answers are "NO", then may proceed with Cephalosporin use.      Medication List    TAKE these medications   acetaminophen 500 MG tablet Commonly known as:  TYLENOL Take 2 tablets (1,000 mg total) by mouth every 6 (six) hours as needed.   docusate sodium 100 MG capsule Commonly known as:  COLACE Take 1 capsule (100 mg total) by mouth 2 (two) times daily.   methocarbamol 500 MG tablet Commonly known as:  ROBAXIN Take 2 tablets (1,000 mg total) by mouth every 8 (eight) hours as needed for muscle spasms.   oxyCODONE 5 MG immediate release tablet Commonly known as:  Oxy IR/ROXICODONE Take 1 tablet (5 mg total) by mouth every 6 (six) hours as needed for severe pain.        Follow-up Information    [facility]. Go on 09/21/2018.   Why:  Your appointment is 09/21/18 at 10:00AM Please arrive 30 minutes prior to your appointment to check in and fill out paperwork. Bring photo ID and insurance information. Contact information: [address] [place] [id number] [phone]       [facility] Go on 09/20/2018.   Why:  You need to have a chest xray the day before your appointment in trauma clinic. You do not have to have an appointment for this. Go on 09/20/18 any time between 8am and 5pm to this location [address], or you can [facility] the [facility]ray. Contact information: [address] [place] [id number] [phone]           Signed: Franne FortsBrooke A Krysteena Stalker, [facility] 09/09/2018, 1:24 PM Pager: [phone] Consults: [phone] Mon 7:00 am -11:30 AM Tues-Fri 7:00 am-4:30 pm Sat-Sun 7:00 am-11:30 am

## 2018-09-09 NOTE — Progress Notes (Signed)
Discharge home. Discharge instruction given to patient with interpreter. No question verbalized.

## 2018-09-09 NOTE — Progress Notes (Signed)
Physical Therapy Treatment Patient Details Name: Douglas Ayers MRN: [id number] DOB: 11/11/1976 Today's Date: 09/09/2018    History of Present Illness Pt presents after falling off roof at work, amnesic to event, L pneumothorax with CT placement on 09/07/18 and rib fxs.     PT Comments    Pt performed gait training and functional mobility during session.  Pt is no longer requiring assistance with transfers and gait training.   Educated patient on performing bed mobility at home from a flat surface with emphasis on log rolling to reduce pain in rib cage.  Pt performed stair training with supervision for safety.  Educated on using incentive spirometer at home to improve lung function.  Pt to d/c post PT session.   Follow Up Recommendations  No PT follow up     Equipment Recommendations  None recommended by PT    Recommendations for Other Services       Precautions / Restrictions Precautions Precautions: None Restrictions Weight Bearing Restrictions: No Other Position/Activity Restrictions: chest tube is d/c'd     Mobility  Bed Mobility Overal bed mobility: Needs Assistance Bed Mobility: Rolling;Sidelying to Sit;Sit to Sidelying Rolling: Supervision Sidelying to sit: Supervision     Sit to sidelying: Supervision General bed mobility comments: Pt educated on bed mobility from a flat surface.  Educated on rolling to reduce pain.  Educated on splinting rib fractures to reduce pain.    Transfers Overall transfer level: Modified independent Equipment used: None Transfers: Sit to/from Stand Sit to Stand: Modified independent (Device/Increase time)         General transfer comment: No assistance needed.    Ambulation/Gait Ambulation/Gait assistance: Modified independent (Device/Increase time) Gait Distance (Feet): 300 Feet Assistive device: None Gait Pattern/deviations: Step-through pattern;Antalgic Gait velocity: decreased   General Gait Details: Good step length  and reciprocal armswing.  Pt slow and guarded due to pain.     Stairs Stairs: Yes Stairs assistance: Supervision Stair Management: No rails;One rail Right Number of Stairs: 4(x2 with R rail and x2 with no rails.  ) General stair comments: Pt performed reciprocal pattern with rail and no reciprocal without rail.  No lob and good technique.     Wheelchair Mobility    Modified Rankin (Stroke Patients Only)       Balance                                            Cognition Arousal/Alertness: Awake/alert Behavior During Therapy: WFL for tasks assessed/performed Overall Cognitive Status: Within Functional Limits for tasks assessed                                        Exercises      General Comments        Pertinent Vitals/Pain Pain Assessment: Faces Faces Pain Scale: Hurts even more Pain Location: L chest Pain Descriptors / Indicators: Grimacing Pain Intervention(s): Monitored during session;Repositioned    Home Living                      Prior Function            PT Goals (current goals can now be found in the care plan section) Acute Rehab PT Goals Patient Stated Goal: return home Potential to Achieve Goals:  Good Progress towards PT goals: Progressing toward goals    Frequency    Min 3X/week      PT Plan Current plan remains appropriate    Co-evaluation              AM-PAC PT "6 Clicks" Daily Activity  Outcome Measure  Difficulty turning over in bed (including adjusting bedclothes, sheets and blankets)?: None Difficulty moving from lying on back to sitting on the side of the bed? : None Difficulty sitting down on and standing up from a chair with arms (e.g., wheelchair, bedside commode, etc,.)?: None Help needed moving to and from a bed to chair (including a wheelchair)?: None Help needed walking in hospital room?: None Help needed climbing 3-5 steps with a railing? : None 6 Click Score: 24     End of Session Equipment Utilized During Treatment: Gait belt Activity Tolerance: Patient tolerated treatment well Patient left: with call bell/phone within reach;with family/visitor present;in bed Nurse Communication: Mobility status PT Visit Diagnosis: Unsteadiness on feet (R26.81);Dizziness and giddiness (R42);Pain Pain - Right/Left: Left Pain - part of body: (L chest)     Time: 2440-1027 PT Time Calculation (min) (ACUTE ONLY): 13 min  Charges:  $Gait Training: 8-22 mins                     Douglas Ayers, PTA Acute Rehabilitation Services Pager [phone] Office [phone]     Douglas Ayers Douglas Ayers 09/09/2018, 3:05 PM

## 2018-09-20 ENCOUNTER — Other Ambulatory Visit: Payer: Self-pay | Admitting: General Surgery

## 2018-09-20 ENCOUNTER — Ambulatory Visit
Admission: RE | Admit: 2018-09-20 | Discharge: 2018-09-20 | Disposition: A | Discharge status: Home / Self Care | Payer: Self-pay | Source: Ambulatory Visit | Attending: General Surgery | Admitting: General Surgery

## 2018-09-20 DIAGNOSIS — J939 Pneumothorax, unspecified: Secondary | ICD-10-CM

## 2019-10-01 IMAGING — CT CT CERVICAL SPINE W/O CM
5 of 8 series · 12 of 33 positions shown, 13 images · non-contrast
Comparison: None.

CLINICAL DATA: Pain following fall

EXAM:
CT HEAD WITHOUT CONTRAST
CT CERVICAL SPINE WITHOUT CONTRAST
TECHNIQUE: Multidetector CT imaging of the head and cervical spine was
performed following the standard protocol without intravenous
contrast. Multiplanar CT image reconstructions of the cervical spine
were also generated.

[Series 5: head bone · axial · 0.42mm/px · z∈[-20,+32]mm · 2 of 80 slices shown]
[im 27/80  bone]
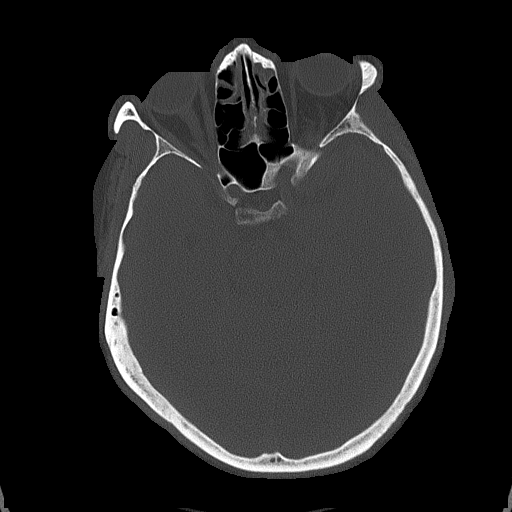
[im 53/80  bone]
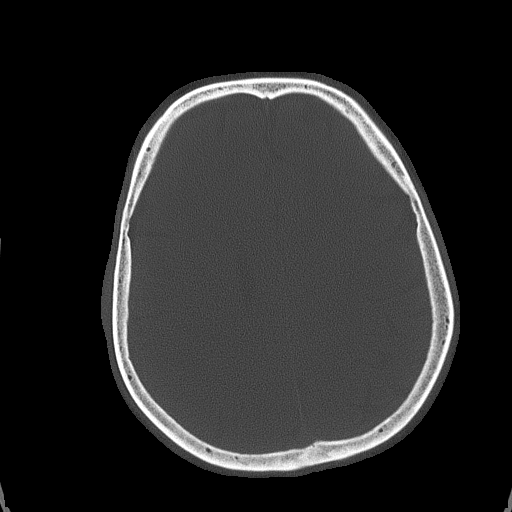

[Series 8: c_spine 2.0 st · axial · 0.26mm/px · z∈[-184,-126]mm · 2 of 89 slices shown]
[im 30/89  bone]
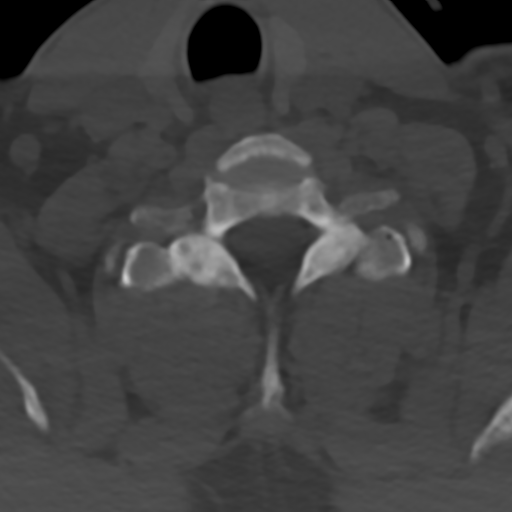
[im 59/89  bone]
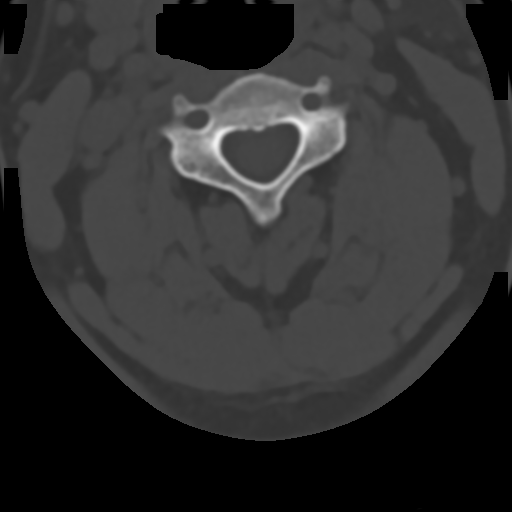

[Series 12: c_spine 2.0 sag bone · sagittal · 0.27mm/px · 4 of 61 slices shown]
[im 13/61  bone]
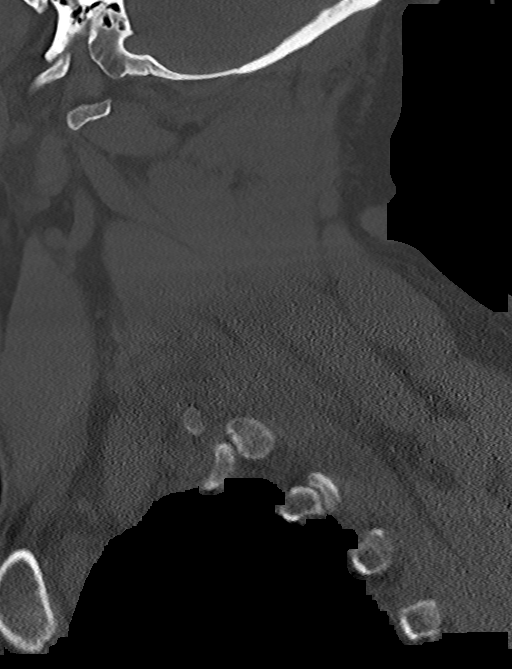
[im 25/61  bone]
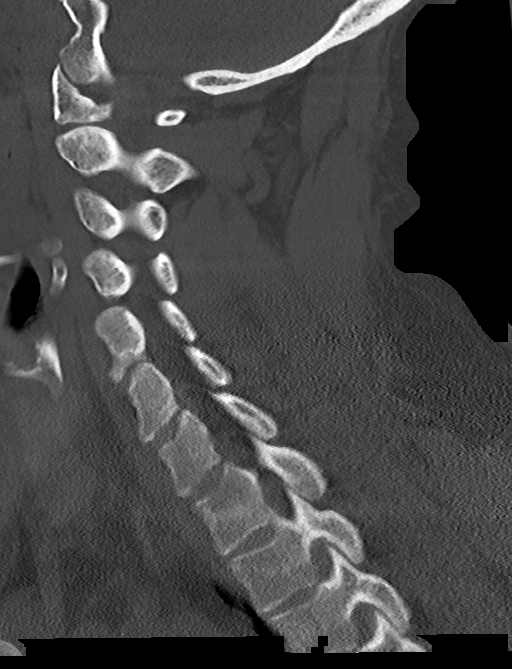
[im 37/61  bone]
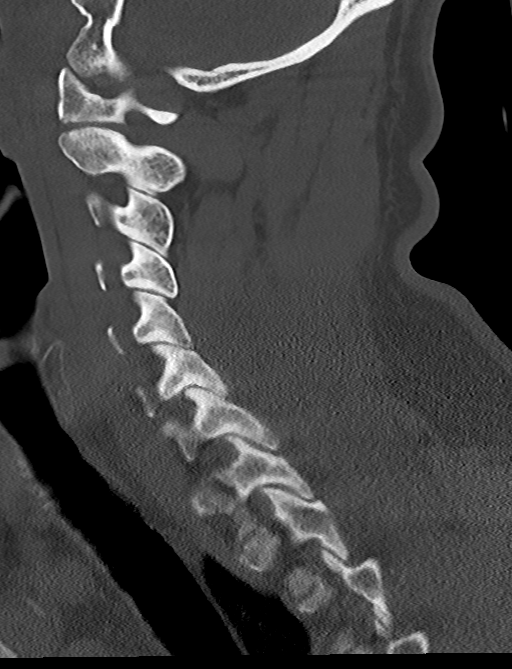
[im 49/61  bone]
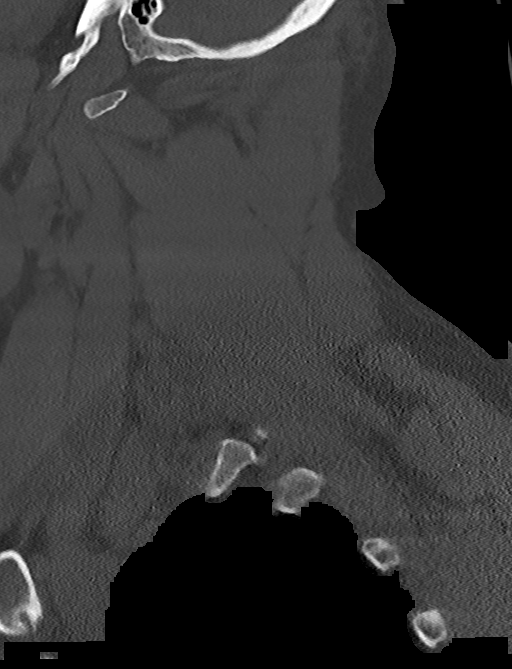

[Series 13: c_spine 2.0 cor bone · coronal · 0.23mm/px · 1 of 61 slices shown]
[im 31/61  bone]
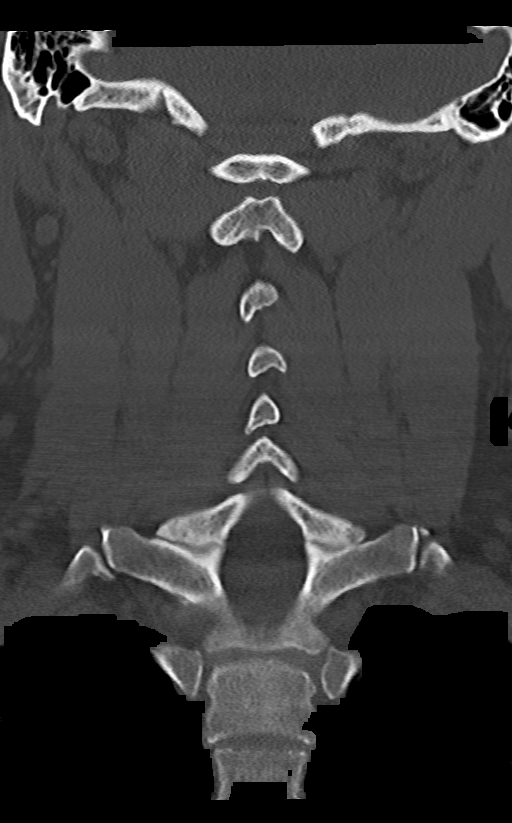

[Series 14: c_spine 2.0 orthogonals · axial · 0.21mm/px · z∈[-224,-131]mm · 3 of 99 slices shown, 4 images]
[im 25/99  soft-tissue]
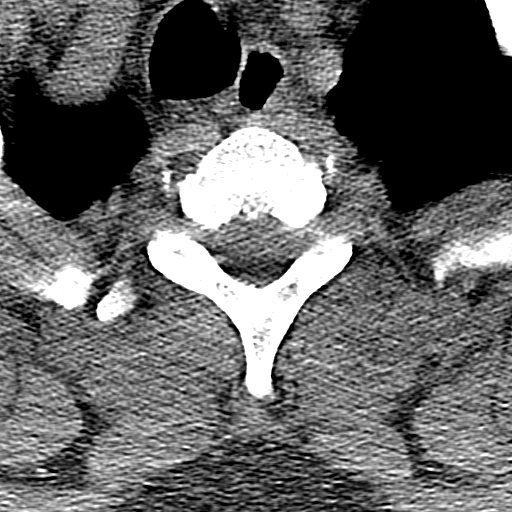
[im 25/99  bone]
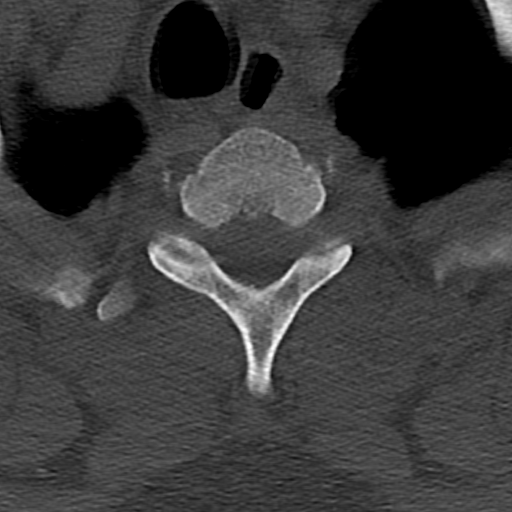
[im 50/99  bone]
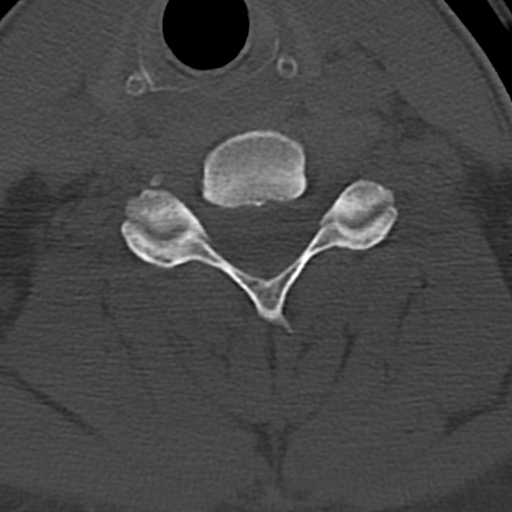
[im 74/99  bone]
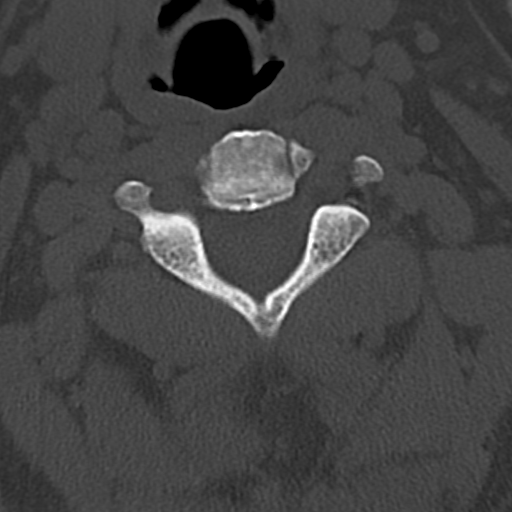

[12 of 33 positions shown; findings below may reference images not displayed]

FINDINGS: CT HEAD FINDINGS

Brain: The ventricles are normal in size and configuration. There is
no intracranial mass, hemorrhage, extra-axial fluid collection, or
midline shift. The gray-white compartments appear normal. No evident
acute infarct.

Vascular: No hyperdense vessel. There is no appreciable vascular
calcification.

Skull: The bony calvarium appears intact.

Sinuses/Orbits: There are retention cysts in the maxillary antra
bilaterally, larger on the right than on the left. There is
opacification and mucosal thickening in several ethmoid air cells
bilaterally. Other visualized paranasal sinuses are clear. Orbits
appear symmetric bilaterally.

Other: Mastoid air cells are clear.

CT CERVICAL SPINE FINDINGS

Alignment: There is no evident spondylolisthesis.

Skull base and vertebrae: Skull base and craniocervical junction
regions appear normal. No evident fracture. No blastic or lytic bone
lesions.

Soft tissues and spinal canal: Prevertebral soft tissues and
predental space regions are normal. No paraspinous lesion. No
evident cord or canal hematoma.

Disc levels: There is no appreciable disc space narrowing. There is
calcification in the anterior ligament at C4-5. There is no
appreciable nerve root edema or effacement. No disc extrusion or
stenosis.

Upper chest: There is a sizable pneumothorax on the left. Visualized
right lung is clear.

Other: None
IMPRESSION: CT head: No intracranial mass or hemorrhage. Gray-white compartments
appear normal. There is multifocal paranasal sinus disease.

CT cervical spine: No fracture or spondylolisthesis. No appreciable
nerve root edema or effacement.

Sizable pneumothorax on the left.

Critical Value/emergent results were called by telephone at the time
of interpretation on 09/06/2018 at [DATE] to PRIMO HARE, PA, who
verbally acknowledged these results.

## 2019-10-01 IMAGING — CR DG ELBOW COMPLETE 3+V*L*
4 series · 4 of 4 positions shown · non-contrast
Comparison: None.

CLINICAL DATA: Left arm pain

EXAM:
LEFT ELBOW - COMPLETE 3+ VIEW

[elbow ap]
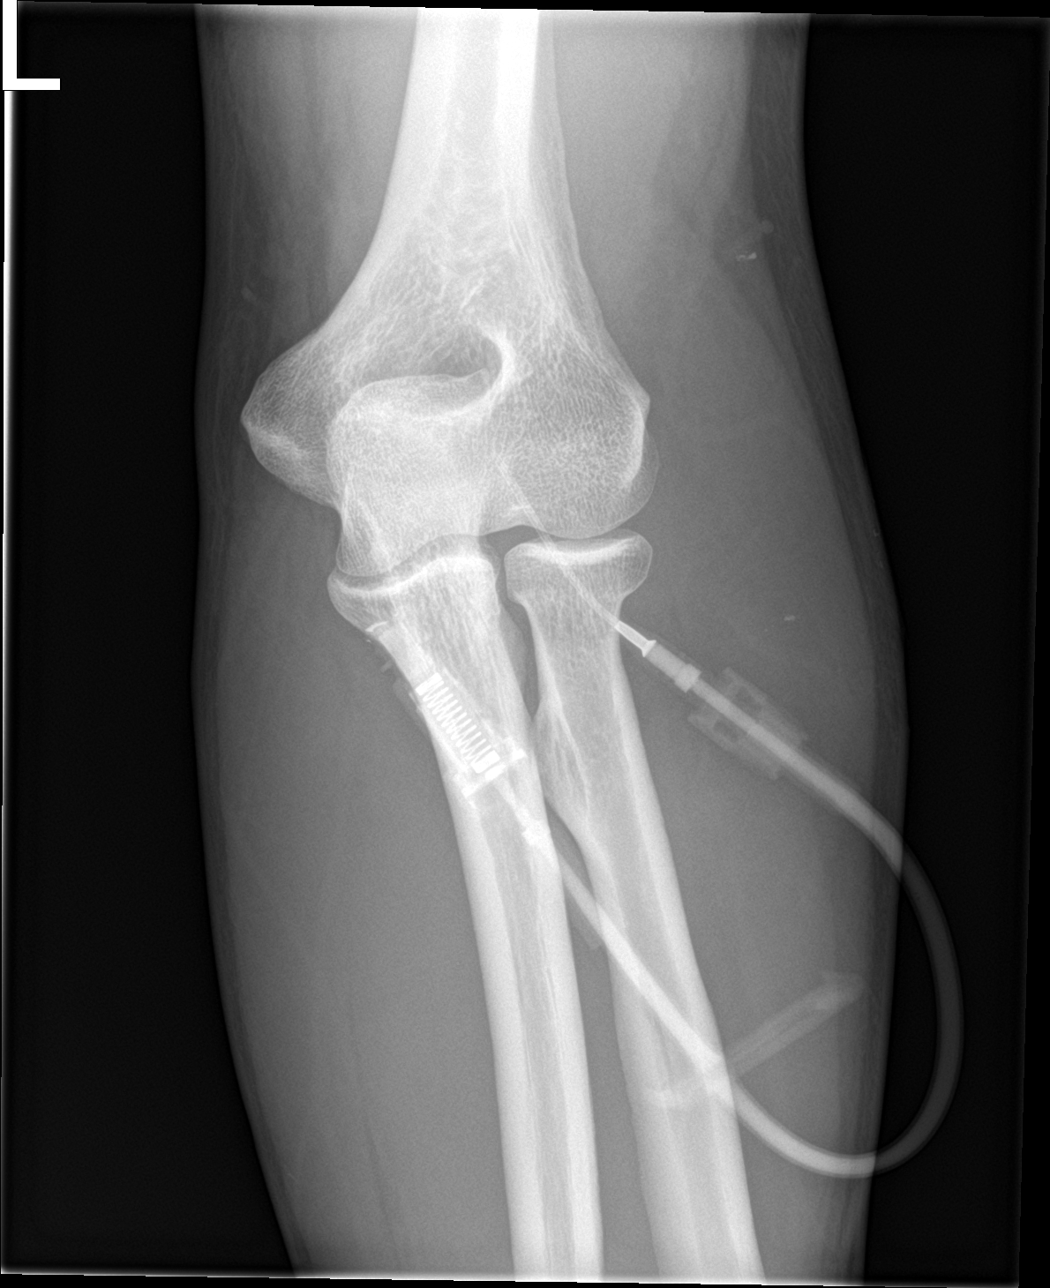

[elbow obl (1 of 2)]
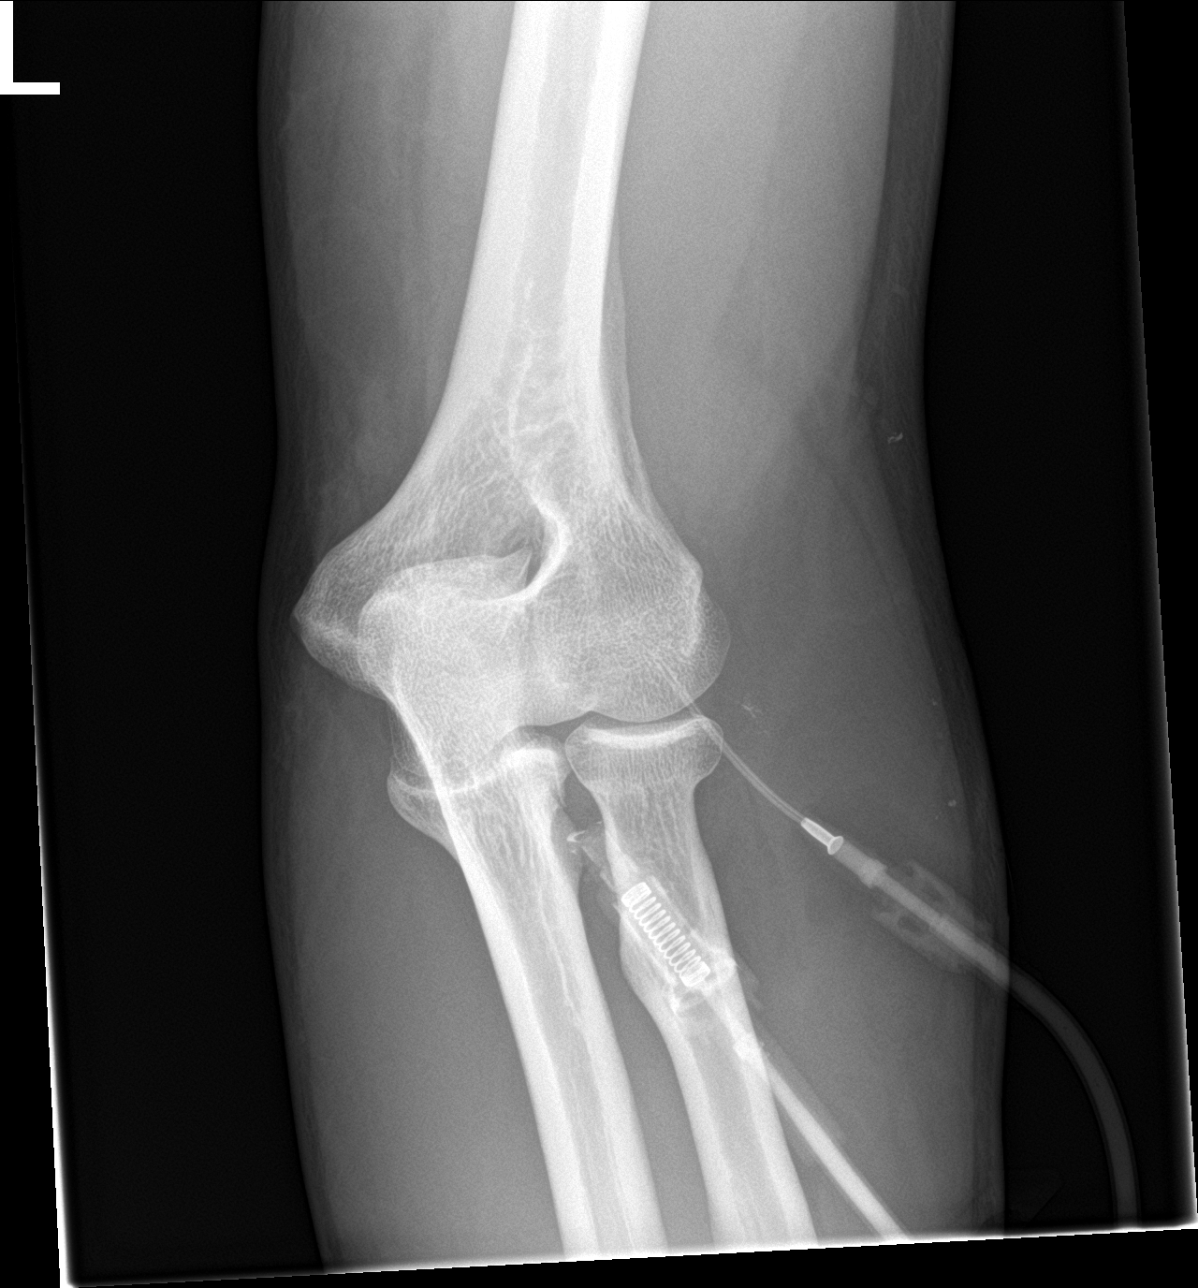

[elbow obl (2 of 2)]
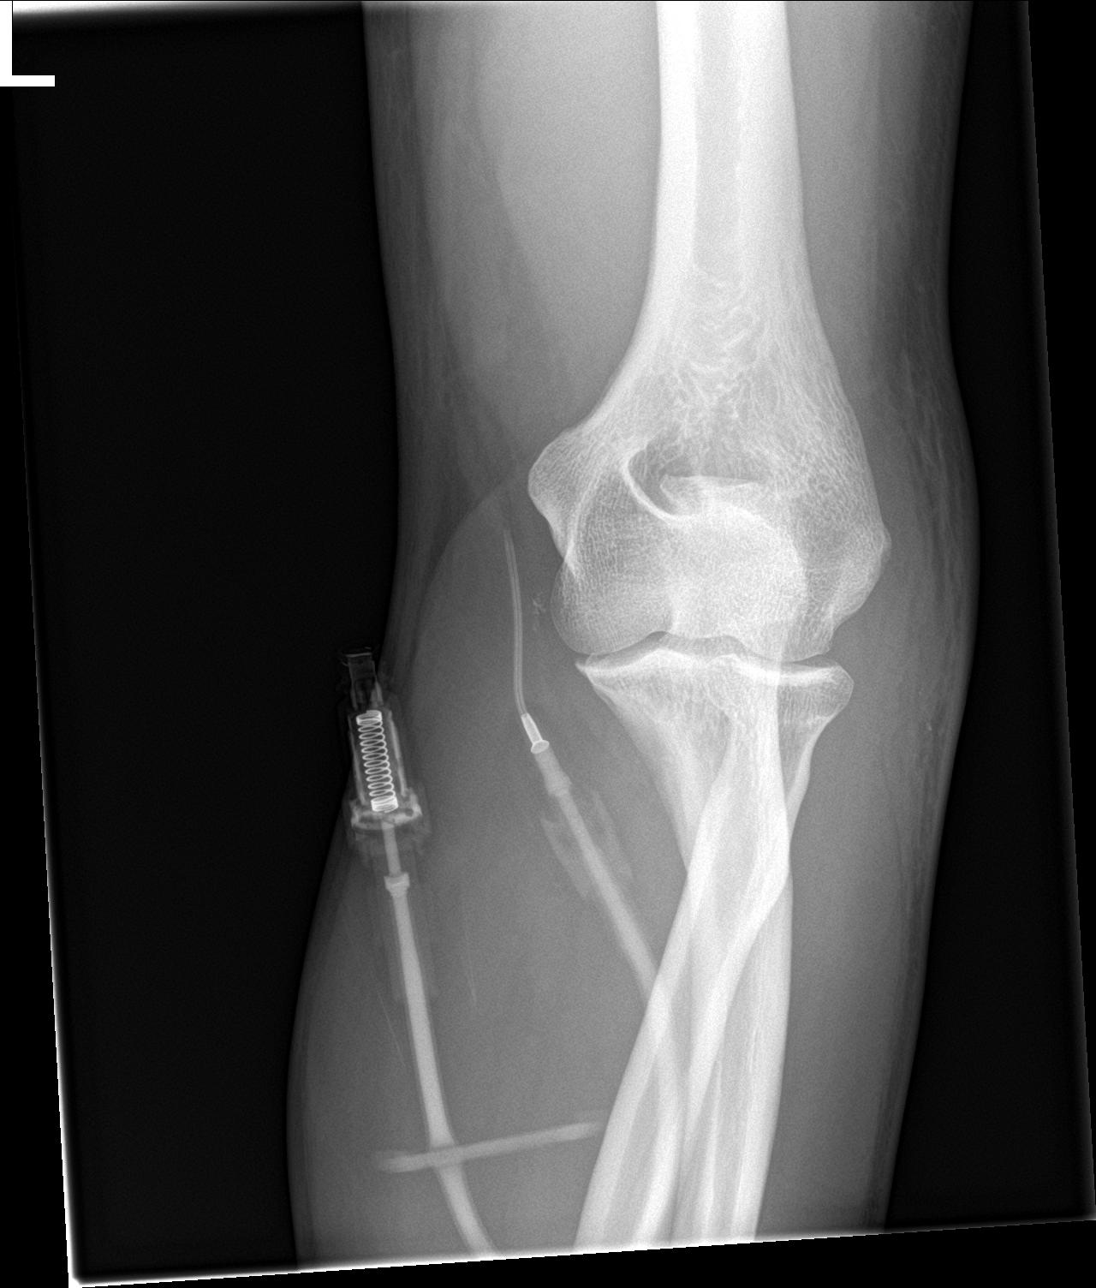

[elbow lat]
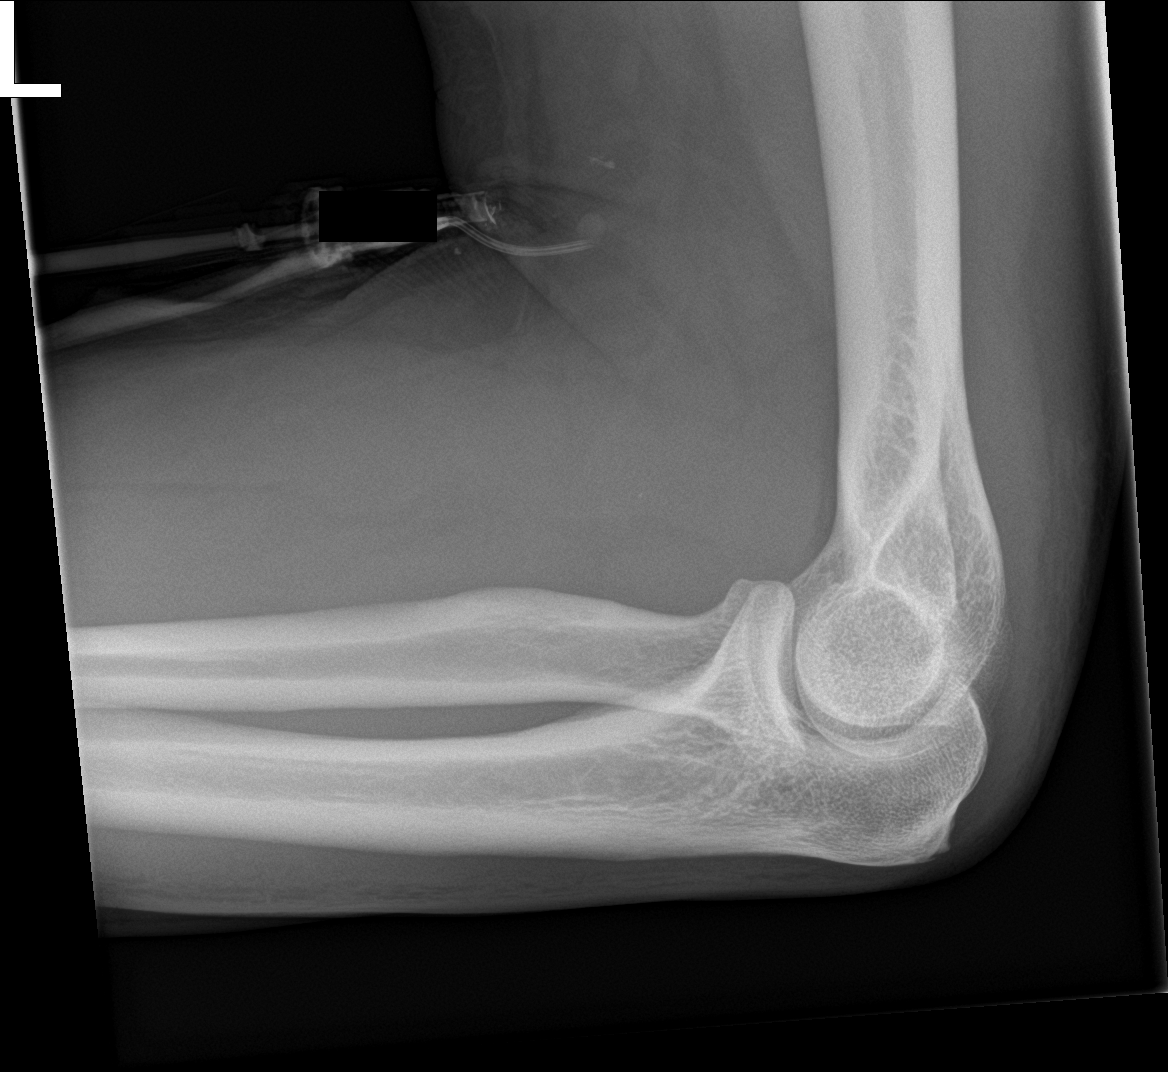

[4 of 4 positions shown; findings below may reference images not displayed]

FINDINGS: No definite acute fracture or dislocation. No evidence of joint
effusion. Several radiopaque punctate and linear opacities project
over the soft tissues of unknown significance.
IMPRESSION: No acute bony pathology.

## 2020-09-10 ENCOUNTER — Inpatient Hospital Stay: Admit: 2020-09-10 | Discharge: 2020-09-10 | Payer: PRIVATE HEALTH INSURANCE

## 2020-09-10 MED ORDER — DIAZEPAM 5 MG TABLET
5 mg | Freq: Once | ORAL | Status: DC
Start: 2020-09-10 — End: 2020-09-11

## 2020-09-10 MED ORDER — IBUPROFEN 600 MG TABLET
600 mg | Freq: Once | ORAL | Status: CP
Start: 2020-09-10 — End: ?
  Administered 2020-09-10: 21:00:00 600 mg via ORAL

## 2020-09-17 NOTE — ED Triage Note
Neck pain radiating into right arm. Feels that arms are week as he is able to only do 10 pushups rather than
